# Patient Record
Sex: Female | Born: 1960
Health system: Southern US, Community
[De-identification: ages and names within clinical notes are randomized; demographics above are authoritative.]

## PROBLEM LIST (undated history)

## (undated) DIAGNOSIS — J302 Other seasonal allergic rhinitis: Secondary | ICD-10-CM

## (undated) HISTORY — DX: Other seasonal allergic rhinitis: J30.2

---

## 1998-05-27 ENCOUNTER — Other Ambulatory Visit: Admission: RE | Admit: 1998-05-27 | Discharge: 1998-05-27 | Payer: Self-pay | Admitting: Obstetrics and Gynecology

## 1998-08-05 ENCOUNTER — Other Ambulatory Visit: Admission: RE | Admit: 1998-08-05 | Discharge: 1998-08-05 | Payer: Self-pay | Admitting: Obstetrics and Gynecology

## 1999-12-22 ENCOUNTER — Other Ambulatory Visit: Admission: RE | Admit: 1999-12-22 | Discharge: 1999-12-22 | Payer: Self-pay | Admitting: Obstetrics and Gynecology

## 2000-07-26 ENCOUNTER — Other Ambulatory Visit: Admission: RE | Admit: 2000-07-26 | Discharge: 2000-07-26 | Payer: Self-pay | Admitting: Obstetrics and Gynecology

## 2001-10-15 ENCOUNTER — Other Ambulatory Visit: Admission: RE | Admit: 2001-10-15 | Discharge: 2001-10-15 | Payer: Self-pay | Admitting: Obstetrics and Gynecology

## 2002-10-23 ENCOUNTER — Other Ambulatory Visit: Admission: RE | Admit: 2002-10-23 | Discharge: 2002-10-23 | Payer: Self-pay | Admitting: Obstetrics and Gynecology

## 2003-11-24 ENCOUNTER — Other Ambulatory Visit: Admission: RE | Admit: 2003-11-24 | Discharge: 2003-11-24 | Payer: Self-pay | Admitting: Obstetrics and Gynecology

## 2005-01-05 ENCOUNTER — Other Ambulatory Visit: Admission: RE | Admit: 2005-01-05 | Discharge: 2005-01-05 | Payer: Self-pay | Admitting: Obstetrics and Gynecology

## 2006-03-09 ENCOUNTER — Other Ambulatory Visit: Admission: RE | Admit: 2006-03-09 | Discharge: 2006-03-09 | Payer: Self-pay | Admitting: Obstetrics and Gynecology

## 2008-06-16 ENCOUNTER — Encounter: Admission: RE | Admit: 2008-06-16 | Discharge: 2008-06-16 | Payer: Self-pay | Admitting: Obstetrics and Gynecology

## 2009-02-15 HISTORY — PX: BUNIONECTOMY: SHX129

## 2009-05-18 IMAGING — US UNKNOWN US STUDY
1 series · 2 of 2 positions shown · non-contrast
Comparison: 05/12/2008

CLINICAL DATA: The patient's physician feels a lump in the 2
o'clock position of the right breast.

DIGITAL DIAGNOSTIC  RIGHT  MAMMOGRAM  ] AND RIGHT BREAST ULTRASOUND

[Series 1: unknown us study · 2 of 2 slices shown]
[im 1/2]
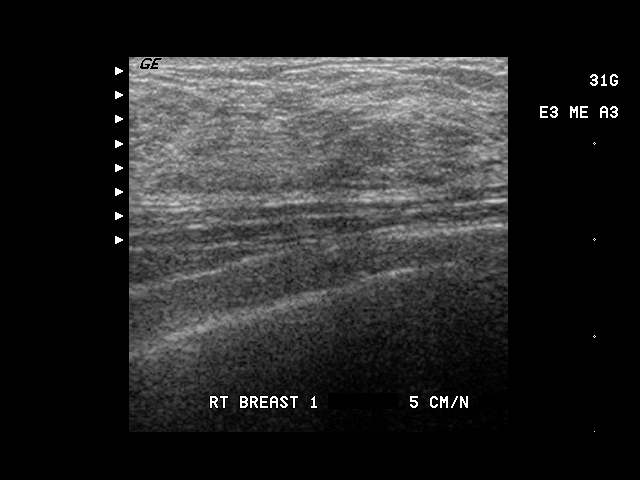
[im 2/2]
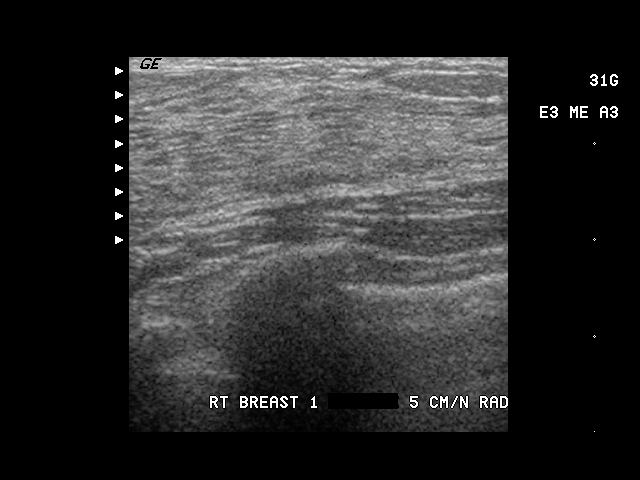

[2 of 2 positions shown; findings below may reference images not displayed]

FINDINGS: A spot tangential view of the palpable finding in the
right upper inner quadrant demonstrates dense fibroglandular tissue
with no distinct mass or distortion.  Recent screening mammogram
dated 05/12/2008 is normal.

On physical exam, I palpate a ridge of thickening at 1 o'clock 5 cm
from the right nipple.

Ultrasound is performed, showing dense fibroglandular tissue with
no mass, distortion or shadowing to suggest malignancy.
IMPRESSION: No mammographic or sonographic evidence of malignancy.  Yearly
screening mammography is suggested unless clinically indicated
earlier.

BI-RADS CATEGORY 1:  Negative.

## 2009-05-18 IMAGING — MG MM DIAGNOSTIC UNILATERAL R
1 series · 1 of 1 positions shown · non-contrast
Comparison: 05/12/2008

CLINICAL DATA: The patient's physician feels a lump in the 2
o'clock position of the right breast.

DIGITAL DIAGNOSTIC  RIGHT  MAMMOGRAM  ] AND RIGHT BREAST ULTRASOUND

[R CC]
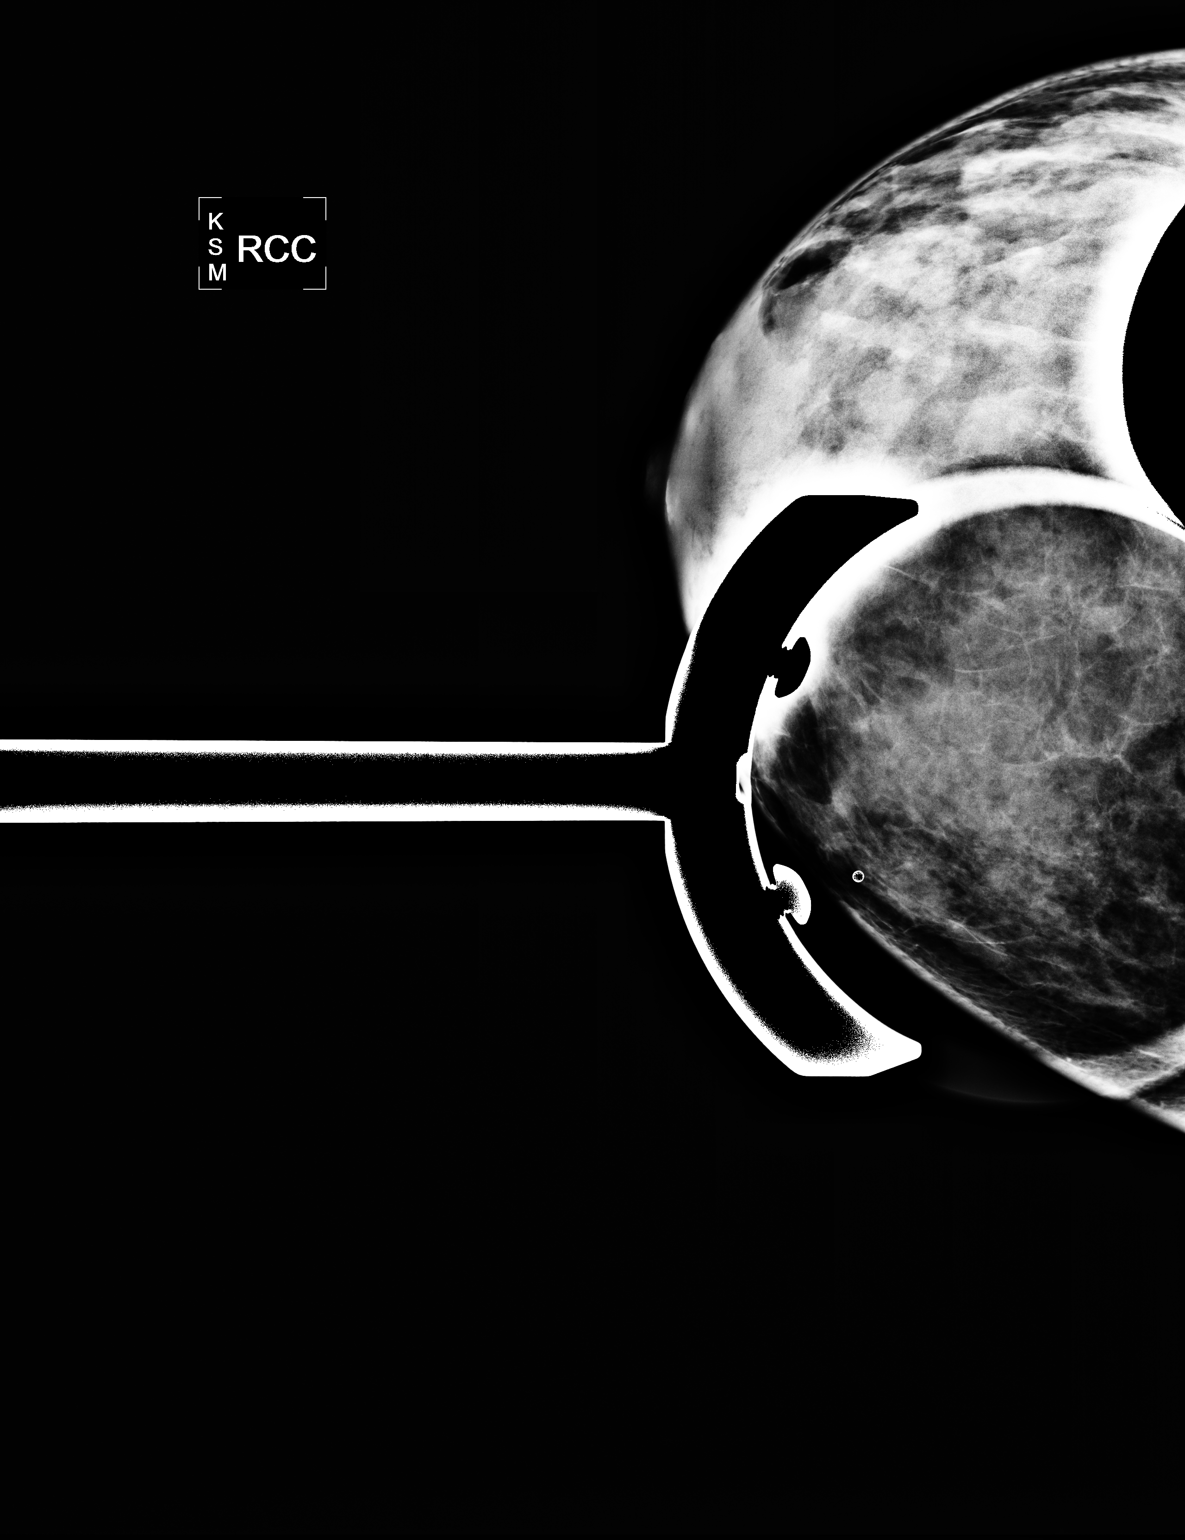

[1 of 1 positions shown; findings below may reference images not displayed]

FINDINGS: A spot tangential view of the palpable finding in the
right upper inner quadrant demonstrates dense fibroglandular tissue
with no distinct mass or distortion.  Recent screening mammogram
dated 05/12/2008 is normal.

On physical exam, I palpate a ridge of thickening at 1 o'clock 5 cm
from the right nipple.

Ultrasound is performed, showing dense fibroglandular tissue with
no mass, distortion or shadowing to suggest malignancy.
IMPRESSION: No mammographic or sonographic evidence of malignancy.  Yearly
screening mammography is suggested unless clinically indicated
earlier.

BI-RADS CATEGORY 1:  Negative.

## 2010-02-28 ENCOUNTER — Ambulatory Visit: Payer: Self-pay | Admitting: Sports Medicine

## 2010-02-28 DIAGNOSIS — M21619 Bunion of unspecified foot: Secondary | ICD-10-CM | POA: Insufficient documentation

## 2010-02-28 DIAGNOSIS — M2170 Unequal limb length (acquired), unspecified site: Secondary | ICD-10-CM | POA: Insufficient documentation

## 2010-02-28 DIAGNOSIS — M79609 Pain in unspecified limb: Secondary | ICD-10-CM | POA: Insufficient documentation

## 2010-04-25 ENCOUNTER — Ambulatory Visit: Payer: Self-pay | Admitting: Sports Medicine

## 2011-01-17 NOTE — Assessment & Plan Note (Signed)
Summary: ORTHOTICS,MC   Vital Signs:  Patient profile:   50 year old female BP sitting:   125 / 85  Vitals Entered By: Lillia Pauls CMA (Apr 25, 2010 9:30 AM)  History of Present Illness: Amber Brennan just ha bunin surgery by dr Johnney Ou 8 wks ago doing very well RT foot not swelling or painful in cast shoe now  comes for orthotics wants to get back into walking leg length diff chronic forefoot pain but less on left  Allergies: 1)  ! Pcn 2)  ! Sulfa  Physical Exam  General:  Well-developed,well-nourished,in no acute distress; alert,appropriate and cooperative throughout examination Msk:  reapaired bunion on RT looks good arch eight sitting fairly high  long arch flattens some w standing some bilat collapse of trans arch gait pronates without orthotics   Impression & Recommendations:  Problem # 1:  FOOT PAIN, RIGHT (ICD-729.5)  This is less now some on left forefoot and 1 mtp  Orders: [employer], each unit (Z6109)  Problem # 2:  BUNION, RIGHT FOOT (ICD-727.1)  better post op and looks like good result  Orders: [employer], each unit (L3002)  Problem # 3:  UNEQUAL LEG LENGTH (ICD-736.81)  left is actually 1.5 cm  Patient was fitted for a standard, cushioned, semi-rigid orthotic.  The orthotic was heated and the patient stood on the orthotic blank positioned on the orthotic stand. The patient was positioned in subtalar neutral position and 10 degrees of ankle dorsiflexion in a weight bearing stance. After completion of molding a stable based was applied to the orthotic blank.   The blank was ground to a stable position for weight bearing. size 7 black stripe fasttek base blue EVA posting  lift on left w burgundy EVA additional orthotic padding  gait looks good w shoulders and hips more level p completion  reck prn  time 35 mins  Orders: [employer], each unit [phone])

## 2011-01-17 NOTE — Assessment & Plan Note (Signed)
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  R bunion.  Getting thigh/hip pain after walking. She has appt with Dr Fonnie Jarvis tomorrow for surgery.    Ballroom dancer, Yoga, weight lifting (3 days/wk), elliptical bike ( 3days/wk).  Used to ballroom dance a lot but now down to 45 min once a week.  Just walking can cause pain.  Work shoes and dance shoes have about 2 - 2 1/2 inches heel.   R 4th MT swollen since 02/2009.    Allergies (verified): 1)  ! Pcn 2)  ! Sulfa  Family History: Mom: bunion   Foot/Ankle Exam  General:    Well-developed, well-nourished ,normal body habitus; no deformities, normal grooming.    Gait:    Normal heel-toe gait pattern bilaterally.    Skin:    Intact with no scars, lesions, rashes, cafe-au-lait spots or bruising.    Inspection:    Enlarged R 1st MTP with spurring Transverse collapse R foot > L Deviation without defomity of R 2nd MT Length L leg > R by 1 cm  Palpation:    non-tender to palpation over distal leg, medial and lateral ankle, distal achilles tendon, medial and lateral hindfoot, posterior heel, plantar heel, midfoot and forefoot bilaterally.   Foot Exam:    Right:    Inspection/Palpation:  Full rom in all toes   Impression & Recommendations:  Problem # 1:  FOOT PAIN, RIGHT (ICD-729.5) Leg length difference is cause of her foot collapse (mechanics problem).  Bunion is also the caue of her pain, but bunionectomy may not correct the mechanics problem.  Since pt is having pain one option is to build a set of orthotics to give increase lenght to R leg.  Pt can have surgery and afterwards she can make her orthotics.   We tried a felt insole in her dress/work shoes to  give her some support and correct the leg length difference.    Problem # 2:  BUNION, RIGHT FOOT (ICD-727.1) I felt the surgical option suggested by Dr Lestine Box was reasonable with her hx of foot pain  Problem # 3:  UNEQUAL LEG LENGTH (ICD-736.81) at completion of surgery I think she needs orthtoics and some correction of leg length to lessen chance of recurrence  Patient Instructions: 1)  After your surgery, if you still vave pain in your foot we can consider building you a set of orthotics to prevent progresion of the disease and give you pain relief.   2)  Try using the felt lift in your right shoe.  This may help you with pain.  3)  Please schedule a follow-up appointment after your rehabilitation is done after the bunionectomy.

## 2011-02-14 ENCOUNTER — Encounter: Payer: Self-pay | Admitting: Sports Medicine

## 2011-02-14 ENCOUNTER — Ambulatory Visit (INDEPENDENT_AMBULATORY_CARE_PROVIDER_SITE_OTHER): Payer: BC Managed Care – PPO | Admitting: Sports Medicine

## 2011-02-14 DIAGNOSIS — M25519 Pain in unspecified shoulder: Secondary | ICD-10-CM

## 2011-02-14 DIAGNOSIS — M62838 Other muscle spasm: Secondary | ICD-10-CM

## 2011-02-23 NOTE — Assessment & Plan Note (Signed)
Summary: RT FRONT SHOULDER PAIN,L BACK SHOULDER PAIN,MC   Vital Signs:  Patient profile:   50 year old female Height:      64.75 inches Weight:      130 pounds BMI:     21.88 BP sitting:   120 / 84  Vitals Entered By: Rochele Pages RN (February 14, 2011 11:45 AM)   History of Present Illness: R shoulder - Pain since 2nd week of January -First happened during yoga pose - (Weight supported arm extension with hip extension) -Persistent pain --Tried: 6 weeks rest, heat, biofreeze  L shoulder/trapezius pain - Started shortly after incident -Tight -"burning sensation" -HX of pulled muscle - trapezius - May 2011 - went away after 1 month  Works - Office manager  no serious shoulder injuries in past  tried massage and this helped only temporarily and would not relieve left trap tried chirpractic w no real relief  Allergies: 1)  ! Pcn 2)  ! Sulfa  Physical Exam  General:  Well-developed,well-nourished,in no acute distress; alert,appropriate and cooperative throughout examination Msk:  RT shoulder Inspection reveals no abnormalities or assymetry; no atrophy noted; palpation is unremarkable;  ROM is full in all planes. specific strength testing of Rotator cuff mm reveals good strength throughout when she has arms at side; with elevation she gets pain on IR and ER testing and some weakness on IR pushhoff test is weak on RT and she has limited ROM 2/2 pain  no signs of impingement; speeds is painful but yergason's tests normal;  OBrien's is + for possible labral pathology. Jobe relocation test causes clicking but no severe pain.  norm scapular function observed.   negative painful arc and no drop arm sign.  Left trapezius is tight w spasm noted  full ROM of neck with no pain or limits spurling test neg    Impression & Recommendations:  Problem # 1:  SHOULDER PAIN, RIGHT (ICD-719.41) I am concerned about labrum  will RTC to see one of fellows and scan the shoudler to be sure no  bicipital or RC tears  work IR strength to stabilize until then  Problem # 2:  MUSCLE SPASM, TRAPEZIUS MUSCLE, LEFT (ICD-728.85) try shake out exercises  I am concerned there may be localized nerve entrapment  will try on HS gabapentin to see if we can break spasm  reck on RTC  Complete Medication List: 1)  Gabapentin 300 Mg Caps (Gabapentin) .... Take 1 by mouth at bedtime  Patient Instructions: 1)  Left Arm Shake Outs - 3 x 30 reps daily 2)  Curls - 3x15 - with band 3)  Internal Rotation - 3x15 - with band 4)  External Rotation - 3x15 - with band 5)  Flys ("[place]" - at gym) - 3 sets x 15 reps (5 - 10 pounds) 6)  Gabapentin 300 mg at night Prescriptions: GABAPENTIN 300 MG CAPS (GABAPENTIN) take 1 by mouth at bedtime  #30 x 3   Entered by:   Rochele Pages RN   Authorized by:   Enid Baas MD   Signed by:   Rochele Pages RN on 02/14/2011   Method used:   Electronically to        [facility] [address] [phone]* (retail)       [address] [place], [place]  [address]       Ph: [phone]       Fax: [phone]   RxID:   [id number]  Orders Added: 1)  Est. Patient Level IV [id number]

## 2011-03-07 ENCOUNTER — Ambulatory Visit (INDEPENDENT_AMBULATORY_CARE_PROVIDER_SITE_OTHER): Payer: BC Managed Care – PPO | Admitting: Family Medicine

## 2011-03-07 DIAGNOSIS — S43429A Sprain of unspecified rotator cuff capsule, initial encounter: Secondary | ICD-10-CM

## 2011-03-07 DIAGNOSIS — M47812 Spondylosis without myelopathy or radiculopathy, cervical region: Secondary | ICD-10-CM

## 2011-03-07 DIAGNOSIS — M62838 Other muscle spasm: Secondary | ICD-10-CM

## 2011-03-07 DIAGNOSIS — M75101 Unspecified rotator cuff tear or rupture of right shoulder, not specified as traumatic: Secondary | ICD-10-CM | POA: Insufficient documentation

## 2011-03-07 MED ORDER — NITROGLYCERIN 0.1 MG/HR TD PT24: MEDICATED_PATCH | TRANSDERMAL | Status: DC

## 2011-03-07 NOTE — Assessment & Plan Note (Addendum)
-   Multi-level degenerative changes as seen on x-rays from chiropractor 02/21/11 - May have element of cervical radiculopathy contributing to shoulder/trapezius pain and spasm - Start taking gabapentin 300mg  q hs as prescribed last office visit - Reviewed home exercises for the neck - May follow-up with chiropractor if desired - f/u 4-6 weeks

## 2011-03-07 NOTE — Assessment & Plan Note (Addendum)
1) Rt subscap tear: - measured 1.27cm x 0.28cm with volume 0.33 cm2 on MSK u/s today - Start on NTG protocol 0.1mg  1/2 patch to shoulder daily.  Reviewed common side effects including HA & rash.  May take tylenol or ibuprofen as needed for HA - Cont home shoulder exercises - f/u 4-6 weeks for re-evaluation & repeat ultrasound

## 2011-03-08 NOTE — Progress Notes (Signed)
Subjective:    Patient ID: Amber Brennan is a 50 y.o. female.  Chief Complaint: HPI 50yo female to office for f/u of R shoulder pain & Lt trapezius pain.  Symptoms only slightly improved.  Saw chiropractor 02/21/11 where x-rays showed neck DJD and has some laser treatments on the shoulder.  Injured shoulder initially 12/2010 while doing yoga.  Cont to have good ROM, but still having pain & popping in the shoulder.  Feels slightly weaker on that side.  Also cont to have tight/burning sensation in left trapezius.  Did not start Gabapentin last visit b/c of fear of side effects.  Has been doing home exercises as directed.    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 Not on file.   Social History Main Topics  . Smoking status: Not on file  . Smokeless tobacco: Not on file  . Alcohol Use: Not on file  . Drug Use: Not on file  . Sexually Active: Not on file    ROS  per HPI, otherwise neg   Objective:   Right Shoulder Exam   Tenderness  The patient is experiencing tenderness in the biceps tendon.  Range of Motion  The patient has normal right shoulder ROM.  Muscle Strength  Abduction: 5/5  Internal Rotation: 4/5  External Rotation: 4/5  Supraspinatus: 5/5  Subscapularis: 4/5  Biceps: 5/5   Tests  Apprehension: negative Drop Arm: negative Hawkin's test: negative Impingement: negative  Other  Erythema: absent Sensation: normal  Comments:  (+)Obriens test, (+)Clunk test for labral pathology. Pain & weakness noted with lift-off test No scapular dyskinesia. No atrophy.   Left Shoulder Exam  Left shoulder exam is normal.  Comments:  TTP in left trapezius with associated mild spasm.  No atrophy noted.         C-spine: ROM decreased by 25% in all planes without pain.  No midline or paraspinal tenderness.  Neg Spurlings to the left, Spurlings to the right reproduces L shoulder pain/burning.  NEURO:  sensation intact to light touch.  DTR +2/4 bicep, tricep, brachioradialis VASC: pulse 2+ & symmetric radial artery b/l  IMAGING: - Personally reviewed x-rays from 02/21/11 from chiropractor showing multi-level degenerative changes, most prominent in area of C4-7.  Large anterior osteophyte complex at area of C4-5, possible posterior spurring at C5.  Mild retrolisthesis of C5 on C6 and C4 on C5.  Flattening of cervical lordosis.  - MSK u/s: R shoulder- normal appearing bicep tendon.  Subscap with intrasubstance tear measuring 1.27cm x 0.28cm (volume 0.33 cm2), increased doppler flow noted in this area.  Tear visible both in long & short views, gapping of the tear noted with dynamic testing.  Normal appearing supraspinatus.  Normal appearing infraspinatus.  Small amount of hypoechoic fluid surrounding AC-joint.  Images saved.  Assessment:     1. Rotator cuff tear, right   2. DJD (degenerative joint disease) of cervical spine   3. MUSCLE SPASM, TRAPEZIUS MUSCLE, LEFT        Plan:    1) Rt subscap tear: - measured 1.27cm x 0.28cm with volume 0.33 cm2 on MSK u/s today - Start on NTG protocol 0.1mg  1/2 patch to shoulder daily.  Reviewed common side effects including HA & rash.  May take tylenol or ibuprofen as needed for HA - Cont home shoulder exercises - f/u 4-6 weeks for re-evaluation & repeat ultrasound  2) Cervical DJD: - Multi-level  degenerative changes as seen on x-rays from chiropractor 02/21/11 - May have element of cervical radiculopathy contributing to shoulder/trapezius pain and spasm - Start taking gabapentin 300mg  q hs as prescribed last office visit - Reviewed home exercises for the neck - May follow-up with chiropractor if desired - f/u 4-6 weeks  3) Lt trapezius spasm  - Unchanged, may be related to cervical radiculopathy as x-rays show multi-level degenerative changes - Start gabapentin as prescribed last visit - Cont home exercises - reassess on f/u in 4-6 weeks

## 2011-03-08 NOTE — Assessment & Plan Note (Signed)
-   Unchanged, may be related to cervical radiculopathy as x-rays show multi-level degenerative changes - Start gabapentin as prescribed last visit - Cont home exercises - reassess on f/u in 4-6 weeks

## 2011-04-04 ENCOUNTER — Other Ambulatory Visit: Payer: BC Managed Care – PPO | Admitting: Family Medicine

## 2011-06-15 ENCOUNTER — Other Ambulatory Visit: Payer: Self-pay | Admitting: Orthopedic Surgery

## 2011-06-15 DIAGNOSIS — M25511 Pain in right shoulder: Secondary | ICD-10-CM

## 2011-06-18 ENCOUNTER — Ambulatory Visit
Admission: RE | Admit: 2011-06-18 | Discharge: 2011-06-18 | Disposition: A | Discharge status: Home / Self Care | Payer: BC Managed Care – PPO | Source: Ambulatory Visit | Attending: Orthopedic Surgery | Admitting: Orthopedic Surgery

## 2011-06-18 DIAGNOSIS — M25511 Pain in right shoulder: Secondary | ICD-10-CM

## 2012-02-05 ENCOUNTER — Ambulatory Visit (INDEPENDENT_AMBULATORY_CARE_PROVIDER_SITE_OTHER): Payer: BC Managed Care – PPO | Admitting: Family Medicine

## 2012-02-05 DIAGNOSIS — J101 Influenza due to other identified influenza virus with other respiratory manifestations: Secondary | ICD-10-CM

## 2012-02-05 DIAGNOSIS — R05 Cough: Secondary | ICD-10-CM

## 2012-02-05 DIAGNOSIS — R509 Fever, unspecified: Secondary | ICD-10-CM

## 2012-02-05 DIAGNOSIS — R059 Cough, unspecified: Secondary | ICD-10-CM

## 2012-02-05 DIAGNOSIS — J111 Influenza due to unidentified influenza virus with other respiratory manifestations: Secondary | ICD-10-CM

## 2012-02-05 LAB — POCT CBC
Granulocyte percent: 78.1 %G (ref 37–80)
Hemoglobin: 13.6 g/dL (ref 12.2–16.2)
MCV: 96.2 fL (ref 80–97)
MID (cbc): 0.3 (ref 0–0.9)
MPV: 7.8 fL (ref 0–99.8)
POC MID %: 7.3 %M (ref 0–12)
Platelet Count, POC: 172 10*3/uL (ref 142–424)
RBC: 4.44 M/uL (ref 4.04–5.48)
WBC: 4.4 10*3/uL — AB (ref 4.6–10.2)

## 2012-02-05 LAB — POCT INFLUENZA A/B
Influenza A, POC: NEGATIVE
Influenza B, POC: POSITIVE

## 2012-02-05 MED ORDER — OSELTAMIVIR PHOSPHATE 75 MG PO CAPS: 75.0000 mg | ORAL_CAPSULE | Freq: Two times a day (BID) | ORAL | Status: AC

## 2012-02-05 NOTE — Progress Notes (Signed)
Subjective:    Patient ID: Amber Brennan, female    DOB: 08-11-1961, 51 y.o.   MRN: [id number]  HPI Amber Brennan is a 51 y.o. female In [place] last week, returned 3 days ago.  Sx's started that evening.  Nasal congestion - progressed to chest congestion 2 days ago, dry cough.  Pains in both arms, pain in body last night.  Jerking/chills/shaking last night for about 10 minutes.  No hx seizure d/o.  Felt like chills all over - temp 102 last night.  Pain below R ribs after shaking, now resolved.  No dysuria  WU:[id number] cold and sinus, advil, cool compresses and fluids.   No flu shot this year.  Others coughing on plane.   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  HENT: Positive for congestion, rhinorrhea and sneezing. Negative for sore throat.   Eyes: Positive for discharge and redness.       Runny eyes.  Respiratory: Positive for cough. Negative for shortness of breath and wheezing.   Cardiovascular: Negative for chest pain.       Sore in chest with coughing only  Gastrointestinal: Negative for nausea, vomiting, abdominal pain and diarrhea.  Genitourinary: Negative for urgency, frequency, hematuria and difficulty urinating.       No bowel/bladder incontinence  Musculoskeletal: Positive for myalgias and arthralgias.       Diffuse myalgias/arthralgias with current illness.  Skin: Negative for rash.  Neurological: Positive for headaches.       Objective:   Physical Exam  Constitutional: She is oriented to person, place, and time. She appears well-developed and well-nourished. No distress.  HENT:  Head: Normocephalic and atraumatic.  Right Ear: Hearing, tympanic membrane, external ear and ear canal normal.  Left Ear: Hearing, tympanic membrane, external ear and ear canal normal.  Nose: Nose normal.  Mouth/Throat: Mucous membranes are normal. No oral lesions. Posterior oropharyngeal erythema  present. No oropharyngeal exudate.         Post op erythematous patches, but no exudate or tonsilar hypertrophy.  Eyes: Conjunctivae and EOM are normal. Pupils are equal, round, and reactive to light.  Neck: Normal range of motion. Neck supple.  Cardiovascular: Normal rate, regular rhythm, normal heart sounds and intact distal pulses.   No murmur heard. Pulmonary/Chest: Effort normal and breath sounds normal. No respiratory distress. She has no wheezes. She has no rhonchi. She has no rales.  Abdominal: Soft. There is no tenderness. There is no CVA tenderness.  Musculoskeletal: Normal range of motion. She exhibits no edema.       Nonfocal.  Lymphadenopathy:    She has no cervical adenopathy.  Neurological: She is alert and oriented to person, place, and time.  Skin: Skin is warm and dry. No rash noted.  Psychiatric: She has a normal mood and affect. Her behavior is normal.    Results for orders placed in visit on 02/05/12  POCT CBC      Component Value Range   WBC 4.4 (*) 4.6 - 10.2 (K/uL)   Lymph, poc 0.6  0.6 - 3.4    POC LYMPH PERCENT 14.6  10 - 50 (%L)   MID (cbc) 0.3  0 - 0.9    POC MID % 7.3  0 - 12 (%M)   POC Granulocyte 3.4  2 - 6.9    Granulocyte percent 78.1  37 - 80 (%G)   RBC 4.44  4.04 - 5.48 (  M/uL)   Hemoglobin 13.6  12.2 - 16.2 (g/dL)   HCT, POC 16.1  09.6 - 47.9 (%)   MCV 96.2  80 - 97 (fL)   MCH, POC 30.6  27 - 31.2 (pg)   MCHC 31.9  31.8 - 35.4 (g/dL)   RDW, POC 04.5     Platelet Count, POC 172  142 - 424 (K/uL)   MPV 7.8  0 - 99.8 (fL)  POCT RAPID STREP A (OFFICE)      Component Value Range   Rapid Strep A Screen Negative  Negative   POCT INFLUENZA A/B      Component Value Range   Influenza A, POC Negative     Influenza B, POC Positive           Assessment & Plan:  Amber Brennan is a 51 y.o. female

## 2012-02-05 NOTE — Patient Instructions (Addendum)
Tamilfu twice per day for 5 days. advil as needed, and fluids, rest.  mucinex if needed for cough. Return to the clinic or go to the nearest emergency room if any of your symptoms worsen or new symptoms occur.  Influenza Facts Flu (influenza) is a contagious respiratory illness caused by the influenza viruses. It can cause mild to severe illness. While most healthy people recover from the flu without specific treatment and without complications, older people, young children, and people with certain health conditions are at higher risk for serious complications from the flu, including death. CAUSES   The flu virus is spread from person to person by respiratory droplets from coughing and sneezing.   A person can also become infected by touching an object or surface with a virus on it and then touching their mouth, eye or nose.   Adults may be able to infect others from 1 day before symptoms occur and up to 7 days after getting sick. So it is possible to give someone the flu even before you know you are sick and continue to infect others while you are sick.  SYMPTOMS   Fever (usually high).   Headache.   Tiredness (can be extreme).   Cough.   Sore throat.   Runny or stuffy nose.   Body aches.   Diarrhea and vomiting may also occur, particularly in children.   These symptoms are referred to as "flu-like symptoms". A lot of different illnesses, including the common cold, can have similar symptoms.  DIAGNOSIS   There are tests that can determine if you have the flu as long you are tested within the first 2 or 3 days of illness.   A doctor's exam and additional tests may be needed to identify if you have a disease that is a complicating the flu.  RISKS AND COMPLICATIONS  Some of the complications caused by the flu include:  Bacterial pneumonia or progressive pneumonia caused by the flu virus.   Loss of body fluids (dehydration).   Worsening of chronic medical conditions, such as  heart failure, asthma, or diabetes.   Sinus problems and ear infections.  HOME CARE INSTRUCTIONS   Seek medical care early on.   If you are at high risk from complications of the flu, consult your health-care provider as soon as you develop flu-like symptoms. Those at high risk for complications include:   People 65 years or older.   People with chronic medical conditions, including diabetes.   Pregnant women.   Young children.   Your caregiver may recommend use of an antiviral medication to help treat the flu.   If you get the flu, get plenty of rest, drink a lot of liquids, and avoid using alcohol and tobacco.   You can take over-the-counter medications to relieve the symptoms of the flu if your caregiver approves. (Never give aspirin to children or teenagers who have flu-like symptoms, particularly fever).  PREVENTION  The single best way to prevent the flu is to get a flu vaccine each fall. Other measures that can help protect against the flu are:  Antiviral Medications   A number of antiviral drugs are approved for use in preventing the flu. These are prescription medications, and a doctor should be consulted before they are used.   Habits for Good Health   Cover your nose and mouth with a tissue when you cough or sneeze, throw the tissue away after you use it.   Wash your hands often with soap and  water, especially after you cough or sneeze. If you are not near water, use an alcohol-based hand cleaner.   Avoid people who are sick.   If you get the flu, stay home from work or school. Avoid contact with other people so that you do not make them sick, too.   Try not to touch your eyes, nose, or mouth as germs ore often spread this way.  IN CHILDREN, EMERGENCY WARNING SIGNS THAT NEED URGENT MEDICAL ATTENTION:  Fast breathing or trouble breathing.   Bluish skin color.   Not drinking enough fluids.   Not waking up or not interacting.   Being so irritable that the  child does not want to be held.   Flu-like symptoms improve but then return with fever and worse cough.   Fever with a rash.  IN ADULTS, EMERGENCY WARNING SIGNS THAT NEED URGENT MEDICAL ATTENTION:  Difficulty breathing or shortness of breath.   Pain or pressure in the chest or abdomen.   Sudden dizziness.   Confusion.   Severe or persistent vomiting.  SEEK IMMEDIATE MEDICAL CARE IF:  You or someone you know is experiencing any of the symptoms above. When you arrive at the emergency center,report that you think you have the flu. You may be asked to wear a mask and/or sit in a secluded area to protect others from getting sick. MAKE SURE YOU:   Understand these instructions.   Monitor your condition.   Seek medical care if you are getting worse, or not improving.  Document Released: 12/07/2003 Document Revised: 08/16/2011 Document Reviewed: 09/02/2009 [facility] Patient Information 2012 [place], [place].

## 2012-03-05 ENCOUNTER — Encounter (HOSPITAL_COMMUNITY): Payer: Self-pay | Admitting: *Deleted

## 2012-03-05 ENCOUNTER — Other Ambulatory Visit: Payer: Self-pay

## 2012-03-05 ENCOUNTER — Ambulatory Visit (INDEPENDENT_AMBULATORY_CARE_PROVIDER_SITE_OTHER): Payer: BC Managed Care – PPO | Admitting: Family Medicine

## 2012-03-05 ENCOUNTER — Emergency Department (HOSPITAL_COMMUNITY): Payer: BC Managed Care – PPO

## 2012-03-05 ENCOUNTER — Observation Stay (HOSPITAL_COMMUNITY)
Admission: EM | Admit: 2012-03-05 | Discharge: 2012-03-05 | Discharge status: Against Medical Advice | Payer: BC Managed Care – PPO | Attending: Emergency Medicine | Admitting: Emergency Medicine

## 2012-03-05 DIAGNOSIS — M542 Cervicalgia: Secondary | ICD-10-CM | POA: Insufficient documentation

## 2012-03-05 DIAGNOSIS — R5383 Other fatigue: Secondary | ICD-10-CM

## 2012-03-05 DIAGNOSIS — M25519 Pain in unspecified shoulder: Secondary | ICD-10-CM | POA: Insufficient documentation

## 2012-03-05 DIAGNOSIS — R6884 Jaw pain: Secondary | ICD-10-CM

## 2012-03-05 DIAGNOSIS — R079 Chest pain, unspecified: Secondary | ICD-10-CM

## 2012-03-05 DIAGNOSIS — Z79899 Other long term (current) drug therapy: Secondary | ICD-10-CM | POA: Insufficient documentation

## 2012-03-05 DIAGNOSIS — R5381 Other malaise: Secondary | ICD-10-CM

## 2012-03-05 LAB — BASIC METABOLIC PANEL
Chloride: 104 mEq/L (ref 96–112)
GFR calc Af Amer: >90 mL/min (ref >90)
GFR calc non Af Amer: >90 mL/min (ref >90)
Potassium: 3.6 mEq/L (ref 3.5–5.1)
Sodium: 138 mEq/L (ref 135–145)

## 2012-03-05 LAB — POCT I-STAT TROPONIN I: Troponin i, poc: 0 ng/mL (ref 0.00–0.08)

## 2012-03-05 LAB — CBC
MCHC: 33.2 g/dL (ref 30.0–36.0)
RDW: 12.9 % (ref 11.5–15.5)
WBC: 6.6 10*3/uL (ref 4.0–10.5)

## 2012-03-05 MED ORDER — ASPIRIN 81 MG PO CHEW
324.0000 mg | CHEWABLE_TABLET | Freq: Once | ORAL | Status: AC
  Administered 2012-03-05: 324 mg via ORAL

## 2012-03-05 MED ORDER — NITROGLYCERIN 0.3 MG SL SUBL
0.4000 mg | SUBLINGUAL_TABLET | SUBLINGUAL | Status: DC | PRN
  Administered 2012-03-05: 0.3 mg via SUBLINGUAL

## 2012-03-05 NOTE — ED Notes (Signed)
Attempted to do EKG per Thurston Hole, NP orders. Pt refused stating "Get those wires away from me. I've already had enough of those tests and I don't want anymore."

## 2012-03-05 NOTE — ED Notes (Signed)
Pt questioning more about CT of heart. Pt very upset that MD that spoke to her about test was not cardiologist. Pt states "This is just ridiculous. I don't think I even want this procedure. I just want to leave." Told pt that I would have Thurston Hole speak to her more in depth on CT

## 2012-03-05 NOTE — ED Provider Notes (Signed)
History     CSN: [id number]  Arrival date & time 03/05/12  1658   First MD Initiated Contact with Patient 03/05/12 1700      Chief Complaint  Patient presents with  . Chest Pain    (Consider location/radiation/quality/duration/timing/severity/associated sxs/prior treatment) HPI  Past Medical History  Diagnosis Date  . Allergy     Past Surgical History  Procedure Date  . Bunionectomy 02/2009    No family history on file.  History  Substance Use Topics  . Smoking status: Never Smoker   . Smokeless tobacco: Not on file  . Alcohol Use: Not on file    OB History    Grav Para Term Preterm Abortions TAB SAB Ect Mult Living                  Review of Systems  Allergies  Penicillins and Sulfonamide derivatives  Home Medications   Current Outpatient Rx  Name Route Sig Dispense Refill  . B COMPLEX-C PO TABS Oral Take 1 tablet by mouth daily.    Marland Kitchen CALCIUM GLUCONATE 500 MG PO TABS Oral Take 500 mg by mouth daily.    Marland Kitchen GLUCOSAMINE-CHONDROITIN 250-200 MG PO TABS Oral Take 1 tablet by mouth daily.    . ADULT MULTIVITAMIN W/MINERALS CH Oral Take 1 tablet by mouth daily.    Gaylord Shih TRI-CYCLEN LO 0.18/0.215/0.25 MG-25 MCG PO TABS      . PATADAY 0.2 % OP SOLN      . VITAMIN E 400 UNITS PO CAPS Oral Take 400 Units by mouth daily.      BP 130/74  Pulse 80  Temp(Src) 98.2 F (36.8 C) (Oral)  Resp 15  SpO2 98%  LMP 02/29/2012  Physical Exam  ED Course  Procedures (including critical care time)   Labs Reviewed  CBC  BASIC METABOLIC PANEL  POCT I-STAT TROPONIN I   Dg Chest 2 View  03/05/2012  *RADIOLOGY REPORT*  Clinical Data: Mid chest and right jaw pain  CHEST - 2 VIEW  Comparison: None  Findings: Normal heart size, mediastinal contours, and pulmonary vascularity. Lungs appear minimally hyperexpanded clear. No pleural effusion or pneumothorax. No acute osseous findings.  IMPRESSION: No acute abnormalities.  Original Report Authenticated By: Lollie Marrow, M.D.       No diagnosis found.    MDM   8469 Report received from Dr. Karma Ganja for CP protocol in CDU.  First troponin negative. No further chest pain. NTG relieved chest pain earlier. Family history of early MI.  Patient expresses that she wants to leave and does not know what is going on.  States that she was not told why she was still here.  Sent from urgent care to get a "few" labs done.  Does not want to spend the night.  Dr. Karma Ganja states she spoke with the patient extensively prior to moving to CDU.  Patient has many of the same questions over and over.  Dr. Karma Ganja and myself both spoke with her extensively.  She will stay until her husband comes to decide. 2100  Patient want to leave AMA.  Discussed the risks involved and patient understands.   Spoke with her and her husband extensively but she "wants to get out of here".  Dr Karma Ganja aware.          Jethro Bastos, NP 03/06/12 1302

## 2012-03-05 NOTE — ED Notes (Signed)
Pt decided to leave ama and not have any further tests. Provider aware

## 2012-03-05 NOTE — ED Notes (Signed)
Patient transported to X-ray 

## 2012-03-05 NOTE — ED Provider Notes (Signed)
History     CSN: [id number]  Arrival date & time 03/05/12  1658   First MD Initiated Contact with Patient 03/05/12 1700      Chief Complaint  Patient presents with  . Chest Pain    (Consider location/radiation/quality/duration/timing/severity/associated sxs/prior treatment) HPI Pt presents with c/o chest pain and neck pain.  Pt states that 2 nights ago she developed neck pressure and pain which yesterday developed into chest pain.  Pt states pain was primarily constant but waxing and waning in nature.  She did state pain radiated to bilateral shoulders.  No sob or nausea or diaphoresis.  No association with exertion. Pt was seen at urgent care today, given SL nitroglycerin which relieved pain and upon arrival to ED patient has 0/10 pain.  Pt was transferred from urgent care for admission for r/o MI.  Pt does have family history of early MI  Past Medical History  Diagnosis Date  . Allergy     Past Surgical History  Procedure Date  . Bunionectomy 02/2009    No family history on file.  History  Substance Use Topics  . Smoking status: Never Smoker   . Smokeless tobacco: Not on file  . Alcohol Use: Not on file    OB History    Grav Para Term Preterm Abortions TAB SAB Ect Mult Living                  Review of Systems ROS reviewed and otherwise negative except for mentioned in HPI  Allergies  Penicillins and Sulfonamide derivatives  Home Medications   Current Outpatient Rx  Name Route Sig Dispense Refill  . B COMPLEX-C PO TABS Oral Take 1 tablet by mouth daily.    Marland Kitchen CALCIUM GLUCONATE 500 MG PO TABS Oral Take 500 mg by mouth daily.    Marland Kitchen GLUCOSAMINE-CHONDROITIN 250-200 MG PO TABS Oral Take 1 tablet by mouth daily.    . ADULT MULTIVITAMIN W/MINERALS CH Oral Take 1 tablet by mouth daily.    Gaylord Shih TRI-CYCLEN LO 0.18/0.215/0.25 MG-25 MCG PO TABS      . PATADAY 0.2 % OP SOLN      . VITAMIN E 400 UNITS PO CAPS Oral Take 400 Units by mouth daily.      BP 124/75  Pulse  78  Temp(Src) 98.2 F (36.8 C) (Oral)  Resp 27  SpO2 98%  LMP 02/29/2012 Vitals reviewed Physical Exam Physical Examination: General appearance - alert, well appearing, and in no distress Mental status - alert, oriented to person, place, and time Eyes - pupils equal and reactive, no scleral icterus Mouth - mucous membranes moist, pharynx normal without lesions Chest - clear to auscultation, no wheezes, rales or rhonchi, symmetric air entry Heart - normal rate, regular rhythm, normal S1, S2, no murmurs, rubs, clicks or gallops Abdomen - soft, nontender, nondistended, no masses or organomegaly, nontender Musculoskeletal - no joint tenderness, deformity or swelling Extremities - peripheral pulses normal, no pedal edema, no clubbing or cyanosis Skin - normal coloration and turgor, no rashes  ED Course  Procedures (including critical care time)  Date: 03/05/2012  Rate: 74  Rhythm: normal sinus rhythm  QRS Axis: normal  Intervals: normal  ST/T Wave abnormalities: normal  Conduction Disutrbances:none  Narrative Interpretation: poor r wave progression  Old EKG Reviewed: none available  8:34 PM Long discussion with patient about plans for testing and CT coronary angio to r/o MI.  I have answered her questions to the best of my ability.  Pt would prefer not to stay overnight, but is considering.   Pt is deciding whether or not she will proceed with testing.  She will talk with her husband.  A complaint she had is the noise at the nurses station and personal talking going on there.  I have discussed this with the charge nurse who will address.       Labs Reviewed  CBC  BASIC METABOLIC PANEL  POCT I-STAT TROPONIN I   Dg Chest 2 View  03/05/2012  *RADIOLOGY REPORT*  Clinical Data: Mid chest and right jaw pain  CHEST - 2 VIEW  Comparison: None  Findings: Normal heart size, mediastinal contours, and pulmonary vascularity. Lungs appear minimally hyperexpanded clear. No pleural effusion or  pneumothorax. No acute osseous findings.  IMPRESSION: No acute abnormalities.  Original Report Authenticated By: Lollie Marrow, M.D.     No diagnosis found.    MDM  Patient presents with chest pain and tightness with radiation to her neck and bilateral shoulders which was intermittent over the past 2 days and completely relieved by one dose of nitroglycerin. Her EKG was reassuring and her initial troponin was also negative. She was placed on the chest pain protocol and plan was for a coronary CT angiogram of her chest. After long discussion with patient about risk stratification of chest pain and risk of possible MI/ACS, pt was initially unsure about agreement in staying for testing, I and PA in CDU both made attempts at answering her questions and providing reassurance about the CDU chest pain protocol and the tests we recommended.  She ultimately made the decision to leave AMA        Ethelda Chick, MD 03/05/12 [phone]

## 2012-03-05 NOTE — ED Notes (Signed)
Iv per ems 

## 2012-03-05 NOTE — ED Notes (Signed)
Provider at bedside informing pt on CPP.

## 2012-03-05 NOTE — ED Notes (Signed)
The pt has had chest tightness since Sunday with lt jaw pain.  4 baby aspirin and sl nitro x1 enroute with relief. The pt refused the 2nd nitro due to  A headache.  Alert oriented

## 2012-03-05 NOTE — Progress Notes (Signed)
  Subjective:    Patient ID: Amber Brennan, female    DOB: [birth year], 51 y.o.   MRN: [id number]  HPI  3 days of chest pressure.  Began as a burning sensation in the jaw and neck, then to pressure in neck, chest.  No SOB, HA, dizziness.  Feels weak, generalized malaise.  Some diarrhea for the first 2 days, had been travelling, but no GI symptoms since then.  No history of HTN, elevated lipids.  Exercises regularly.  Non-smoker.  Review of Systems As above, otherwise negative.    Objective:   Physical Exam  Vital signs noted. Well-developed, well nourished WF who is awake, alert and oriented, in NAD. HEENT: [place]/AT, sclera and conjunctiva are clear.  Neck: supple, non-tender, no lymphadenopathy, thyromegaly. No carotid bruits. Heart: RRR, no murmur, rubs or gallops. Lungs: CTA Skin: warm and dry without rash.  EKG: NSR.  ASA 324 mg given PO.  IV 20 gauge, NS KVO.  NTG 0.4 mg SL.   EMS contacted.  ED notified. Complains of HA, some improvement in pain on the left side of the neck/jaw.        Assessment & Plan:  CP, jaw pain.  Despite normal EKG, her symptoms are concerning.  Transport to ED for rule out MI.  Discussed with Dr. Patsy Lager.

## 2012-03-05 NOTE — ED Notes (Signed)
Pt sts she has had chest pain off and on since a couple days ago with some pain in the tops of her arms and into the right side of her neck. Denies any nausea or vomiting or SOB. Pt states she is healthy and doesn't have any health problems

## 2012-03-05 NOTE — ED Notes (Signed)
Pt left ama. Pt expressed gratitude for help but stated "I'm just not old enough to spend the night in the hospital yet." NAD noted currently.

## 2012-03-05 NOTE — Progress Notes (Signed)
Agree with care by Theora Gianotti PA-C.  Patient sent to ED for further evaluation

## 2012-03-05 NOTE — ED Notes (Signed)
Patient states she  Is waiting for her husband and that she would be leaving AMA

## 2012-03-06 ENCOUNTER — Inpatient Hospital Stay (HOSPITAL_COMMUNITY): Admit: 2012-03-06 | Payer: BC Managed Care – PPO

## 2012-03-06 ENCOUNTER — Telehealth: Payer: Self-pay | Admitting: Physician Assistant

## 2012-03-06 ENCOUNTER — Ambulatory Visit (HOSPITAL_COMMUNITY): Admission: RE | Admit: 2012-03-06 | Payer: BC Managed Care – PPO | Source: Ambulatory Visit

## 2012-03-06 NOTE — Telephone Encounter (Signed)
Please call this patient for an update.  I read the ED record and see that she left against their advice.  I would recommend that she see a cardiologist.  If she agrees, I'll refer her.

## 2012-03-06 NOTE — Telephone Encounter (Signed)
lmom to cb with update.

## 2012-03-07 NOTE — ED Provider Notes (Signed)
Medical screening examination/treatment/procedure(s) were conducted as a shared visit with non-physician practitioner(s) and myself.  I personally evaluated the patient during the encounter  I saw this patient primarily  Ethelda Chick, MD 03/07/12 [phone]

## 2012-03-08 ENCOUNTER — Ambulatory Visit (INDEPENDENT_AMBULATORY_CARE_PROVIDER_SITE_OTHER): Payer: BC Managed Care – PPO | Admitting: Cardiology

## 2012-03-08 ENCOUNTER — Telehealth: Payer: Self-pay | Admitting: Cardiology

## 2012-03-08 ENCOUNTER — Other Ambulatory Visit: Payer: Self-pay | Admitting: Cardiovascular Disease

## 2012-03-08 ENCOUNTER — Encounter: Payer: Self-pay | Admitting: Cardiology

## 2012-03-08 ENCOUNTER — Ambulatory Visit (HOSPITAL_COMMUNITY)
Admission: RE | Admit: 2012-03-08 | Discharge: 2012-03-08 | Disposition: A | Discharge status: Home / Self Care | Payer: BC Managed Care – PPO | Source: Ambulatory Visit | Attending: Cardiology | Admitting: Cardiology

## 2012-03-08 DIAGNOSIS — R079 Chest pain, unspecified: Secondary | ICD-10-CM

## 2012-03-08 MED ORDER — NITROGLYCERIN 0.4 MG SL SUBL
SUBLINGUAL_TABLET | SUBLINGUAL | Status: AC
  Filled 2012-03-08: qty 25

## 2012-03-08 MED ORDER — NITROGLYCERIN 0.4 MG SL SUBL
0.4000 mg | SUBLINGUAL_TABLET | Freq: Once | SUBLINGUAL | Status: AC
  Administered 2012-03-08: 0.4 mg via SUBLINGUAL

## 2012-03-08 MED ORDER — METOPROLOL TARTRATE 1 MG/ML IV SOLN
5.0000 mg | INTRAVENOUS | Status: DC | PRN
  Administered 2012-03-08 (×2): 5 mg via INTRAVENOUS

## 2012-03-08 MED ORDER — IOHEXOL 350 MG/ML SOLN
80.0000 mL | Freq: Once | INTRAVENOUS | Status: AC | PRN
  Administered 2012-03-08: 80 mL via INTRAVENOUS

## 2012-03-08 MED ORDER — METOPROLOL TARTRATE 1 MG/ML IV SOLN
INTRAVENOUS | Status: AC
  Administered 2012-03-08: 5 mg via INTRAVENOUS
  Filled 2012-03-08: qty 15

## 2012-03-08 MED ORDER — NITROGLYCERIN 0.4 MG SL SUBL: 0.4000 mg | SUBLINGUAL_TABLET | SUBLINGUAL | Status: DC | PRN

## 2012-03-08 NOTE — Progress Notes (Signed)
HPI The patient presents for evaluation of chest discomfort. Last week while traveling in [place] she felt very weak. This was episodic. In retrospect she's had increased dizziness and weakness with exercise over the last couple of weeks. She came back into town and on Sunday night she had pain in her jaw and chin. This was 5/10 in intensity. She had never had this before. It was an aching. She was able to go to bed and it eventually went away. Monday while at her job which is not physically exerting she developed chest discomfort along with the jaw discomfort. She has had this on and off since then.  It is moderate in intensity.  She's not describing associated nausea or vomiting. She's not describing new shortness of breath, palpitations, presyncope, PND or orthopnea. She did present to urgent care and was actually treated with nitroglycerin with improvement. She was sent to the emergency room where enzymes were negative and there were no acute findings on EKG though she had poor anterior R wave progression. It was suggested that the patient be admitted and have a CT angiogram. However, she declined. Since then she has continued to have this discomfort off and on. He has tried to ride her exercise bike but has noticed significant exercise limitation. Her husband finally convinced her to come to the cardiologist.  Of note I reviewed the hospital records and she left the ER AMA.  Allergies  Allergen Reactions  . Penicillins Other (See Comments)    Gi upset   . Sulfonamide Derivatives Hives and Itching    Current Outpatient Prescriptions  Medication Sig Dispense Refill  . B Complex-C (B-COMPLEX WITH VITAMIN C) tablet Take 1 tablet by mouth daily.      . calcium gluconate 500 MG tablet Take 500 mg by mouth daily.      . Glucosamine-Chondroitin (OSTEO BI-FLEX REGULAR STRENGTH) 250-200 MG TABS Take 1 tablet by mouth daily.      . Multiple Vitamin (MULITIVITAMIN WITH MINERALS) TABS Take 1 tablet by  mouth daily.      Gaylord Shih TRI-CYCLEN LO 0.18/0.215/0.25 MG-25 MCG TABS       . PATADAY 0.2 % SOLN       . vitamin E 400 UNIT capsule Take 400 Units by mouth daily.       Current Facility-Administered Medications  Medication Dose Route Frequency Provider Last Rate Last Dose  . nitroGLYCERIN (NITROSTAT) SL tablet 0.3 mg  0.3 mg Sublingual Q5 Min x 3 PRN Chelle S Jeffery, PA-C   0.3 mg at 03/05/12 1617    Past Medical History  Diagnosis Date  . Seasonal rhinitis     Past Surgical History  Procedure Date  . Bunionectomy 02/2009    Family History  Problem Relation Age of Onset  . Heart disease Father 60  . Hypertension Mother   . Cancer Mother 67    Ovarian    History   Social History  . Marital Status: Married    Spouse Name: N/A    Number of Children: N/A  . Years of Education: N/A   Occupational History  . Financial analyist    Social History Main Topics  . Smoking status: Never Smoker   . Smokeless tobacco: Not on file  . Alcohol Use: Not on file  . Drug Use: Not on file  . Sexually Active: Not on file   Other Topics Concern  . Not on file   Social History Narrative   Lives at home with  husband.    ROS:  As stated in the HPI and negative for all other systems.  PHYSICAL EXAM BP 110/80  Pulse 70  Ht 5\' 5"  (1.651 m)  Wt 130 lb (58.968 kg)  BMI 21.63 kg/m2  LMP 02/29/2012 GENERAL:  Well appearing HEENT:  Pupils equal round and reactive, fundi not visualized, oral mucosa unremarkable NECK:  No jugular venous distention, waveform within normal limits, carotid upstroke brisk and symmetric, no bruits, no thyromegaly LYMPHATICS:  No cervical, inguinal adenopathy LUNGS:  Clear to auscultation bilaterally BACK:  No CVA tenderness CHEST:  Unremarkable HEART:  PMI not displaced or sustained,S1 and S2 within normal limits, no S3, no S4, no clicks, no rubs, no murmurs ABD:  Flat, positive bowel sounds normal in frequency in pitch, no bruits, no rebound, no  guarding, no midline pulsatile mass, no hepatomegaly, no splenomegaly EXT:  2 plus pulses throughout, no edema, no cyanosis no clubbing SKIN:  No rashes no nodules NEURO:  Cranial nerves II through XII grossly intact, motor grossly intact throughout PSYCH:  Cognitively intact, oriented to person place and time  EKG:  Sinus rhythm, rate 74, axis within normal limits, intervals within normal limits, no acute ST-T wave changes. Poor anterior R wave progression. Cannot exclude old anteroseptal MI  03/05/2012   ASSESSMENT AND PLAN

## 2012-03-08 NOTE — Patient Instructions (Signed)
The current medical regimen is effective;  continue present plan and medications.  Non-Cardiac CT Angiography (CTA), is a special type of CT scan that uses a computer to produce multi-dimensional views of major blood vessels throughout the body. In CT angiography, a contrast material is injected through an IV to help visualize the blood vessels. Please do not eat or drink anything before the procedure.  Please report to Radiology Department at [facility] today at 12N.  Follow up will be based on results

## 2012-03-08 NOTE — Telephone Encounter (Signed)
Pt to have Cardiac CTA@[facility] today@2  p.m.  [employer] has denied prior authorization for test.  Dr. Antoine Poche conducted MD peer review with [facility] precert company, [employer] MD - still denied.  Spoke with both pt and pt's husband about [employer] denial.   Quoted approximately $1800.00 for self pay price.  They are both in agreement to be self pay.

## 2012-03-08 NOTE — Telephone Encounter (Signed)
LMOM to CB. 

## 2012-03-08 NOTE — Assessment & Plan Note (Addendum)
Chest pain is as described. I think the pretest probability of obstructive coronary disease is high. In this situation I think the possibility of spontaneous coronary dissection should be considered. Given this stress testing would be high risk. In addition there is some risk for cardiac catheterization as the first test in this patient. CT coronary angiography is indicated and appropriate. I agree that she should have stayed to have thiswhen she was in the emergency room recently. She agrees to have it done today. Further treatment will be based on this result.  Of note she understands that this procedure will not be pre approved by her insurance.

## 2012-03-08 NOTE — Telephone Encounter (Signed)
Please call patient again

## 2012-03-09 NOTE — Telephone Encounter (Signed)
LMOM TO CB 

## 2012-03-10 NOTE — Telephone Encounter (Signed)
LMOM TO CB 

## 2012-03-11 NOTE — Telephone Encounter (Signed)
Dr. Jenene Slicker notes reviewed.

## 2012-03-11 NOTE — Progress Notes (Signed)
Addended by: Judithe Modest D on: 03/11/2012 05:26 PM   Modules accepted: Orders

## 2012-03-11 NOTE — Progress Notes (Signed)
Observation review is complete for 3/19 visit.

## 2012-03-11 NOTE — Telephone Encounter (Signed)
LMOM at H and C #s to CB. Sent Unable to Reach letter with information from [place], and that we see in the records that she saw Dr Antoine Poche on 3/22. Chelle, I'm forwarding this back FYI - see OV and telephone notes in Epic from Dr Antoine Poche.

## 2012-03-28 ENCOUNTER — Ambulatory Visit (INDEPENDENT_AMBULATORY_CARE_PROVIDER_SITE_OTHER): Payer: BC Managed Care – PPO | Admitting: Cardiology

## 2012-03-28 ENCOUNTER — Encounter: Payer: Self-pay | Admitting: Cardiology

## 2012-03-28 DIAGNOSIS — R079 Chest pain, unspecified: Secondary | ICD-10-CM

## 2012-03-28 DIAGNOSIS — E785 Hyperlipidemia, unspecified: Secondary | ICD-10-CM

## 2012-03-28 NOTE — Assessment & Plan Note (Signed)
No further cardiovascular testing is suggested. She will continue with primary risk reduction.

## 2012-03-28 NOTE — Patient Instructions (Signed)
The current medical regimen is effective;  continue present plan and medications.  Follow up as needed 

## 2012-03-28 NOTE — Assessment & Plan Note (Signed)
She has had some mild dyslipidemia in the past he will followup with her primary provider.

## 2012-03-28 NOTE — Progress Notes (Signed)
   HPI The patient presents for evaluation of chest discomfort. This is extensively described in a previous note. Her symptoms were consistent with unstable angina. However, CT angiography demonstrated only minimal 30% diagonal plaque. She has since had resolution of her symptoms. She slowly got back her bicycle and has had no recurrent chest discomfort dizziness or shortness of breath. She's not back up to the exercise level that she was.  Allergies  Allergen Reactions  . Penicillins Other (See Comments)    Gi upset   . Sulfonamide Derivatives Hives and Itching    Current Outpatient Prescriptions  Medication Sig Dispense Refill  . B Complex-C (B-COMPLEX WITH VITAMIN C) tablet Take 1 tablet by mouth daily.      . calcium gluconate 500 MG tablet Take 500 mg by mouth daily.      . Glucosamine-Chondroitin (OSTEO BI-FLEX REGULAR STRENGTH) 250-200 MG TABS Take 1 tablet by mouth daily.      . Multiple Vitamin (MULITIVITAMIN WITH MINERALS) TABS Take 1 tablet by mouth daily.      . nitroGLYCERIN (NITROSTAT) 0.4 MG SL tablet Place 1 tablet (0.4 mg total) under the tongue every 5 (five) minutes as needed for chest pain.  25 tablet  3  . ORTHO TRI-CYCLEN LO 0.18/0.215/0.25 MG-25 MCG TABS       . PATADAY 0.2 % SOLN       . vitamin E 400 UNIT capsule Take 400 Units by mouth daily.      Marland Kitchen DISCONTD: gabapentin (NEURONTIN) 300 MG capsule        Current Facility-Administered Medications  Medication Dose Route Frequency Provider Last Rate Last Dose  . nitroGLYCERIN (NITROSTAT) SL tablet 0.3 mg  0.3 mg Sublingual Q5 Min x 3 PRN Chelle S Jeffery, PA-C   0.3 mg at 03/05/12 1617    Past Medical History  Diagnosis Date  . Seasonal rhinitis     Past Surgical History  Procedure Date  . Bunionectomy 02/2009    ROS:  As stated in the HPI and negative for all other systems.  PHYSICAL EXAM BP 120/80  Pulse 60  Ht 5\' 4"  (1.626 m)  Wt 128 lb (58.06 kg)  BMI 21.97 kg/m2  LMP 02/29/2012 GENERAL:  Well  appearing LUNGS:  Clear to auscultation bilaterally HEART:  PMI not displaced or sustained,S1 and S2 within normal limits, no S3, no S4, no clicks, no rubs, no murmurs ABD:  Flat, positive bowel sounds normal in frequency in pitch, no bruits, no rebound, no guarding, no midline pulsatile mass, no hepatomegaly, no splenomegaly EXT:  2 plus pulses throughout, no edema, no cyanosis no clubbing   ASSESSMENT AND PLAN

## 2012-04-03 ENCOUNTER — Institutional Professional Consult (permissible substitution): Payer: BC Managed Care – PPO | Admitting: Cardiovascular Disease

## 2012-05-20 ENCOUNTER — Ambulatory Visit (INDEPENDENT_AMBULATORY_CARE_PROVIDER_SITE_OTHER): Payer: BC Managed Care – PPO | Admitting: Emergency Medicine

## 2012-05-20 VITALS — BP 155/93 | HR 83 | Temp 98.5°F | Resp 16 | Ht 64.25 in | Wt 124.6 lb

## 2012-05-20 DIAGNOSIS — H8110 Benign paroxysmal vertigo, unspecified ear: Secondary | ICD-10-CM

## 2012-05-20 MED ORDER — MECLIZINE HCL 32 MG PO TABS: 32.0000 mg | ORAL_TABLET | Freq: Three times a day (TID) | ORAL | Status: AC | PRN

## 2012-05-20 NOTE — Patient Instructions (Signed)

## 2012-05-20 NOTE — Progress Notes (Signed)
  Subjective:    Patient ID: Amber Brennan, female    DOB: [birth year], 51 y.o.   MRN: [id number]  HPI Comments: No history of injury.  No headache or neurological or visual symptoms.  Sudden onset of BPV 1 week ago with no provocative factors.  No improvement during interval.  No other neuro or visual symptoms.     Review of Systems  Constitutional: Negative.   HENT: Negative.   Eyes: Negative.   Cardiovascular: Negative.   Gastrointestinal: Negative.   Genitourinary: Negative.   Musculoskeletal: Negative.   Neurological: Positive for dizziness. Negative for tremors, seizures, syncope, facial asymmetry, speech difficulty, weakness, light-headedness, numbness and headaches.  Psychiatric/Behavioral: Negative.        Objective:   Physical Exam  Constitutional: She is oriented to person, place, and time. She appears well-developed and well-nourished. No distress.  HENT:  Head: Normocephalic and atraumatic.  Right Ear: External ear normal.  Left Ear: External ear normal.  Nose: Nose normal.  Mouth/Throat: Oropharynx is clear and moist.  Eyes: Conjunctivae are normal. Pupils are equal, round, and reactive to light.  Neck: Normal range of motion. Neck supple.  Cardiovascular: Normal rate, regular rhythm and normal heart sounds.   Pulmonary/Chest: Effort normal and breath sounds normal.  Abdominal: Soft.  Musculoskeletal: Normal range of motion.  Neurological: She is alert and oriented to person, place, and time. She exhibits normal muscle tone. Coordination normal.  Skin: Skin is warm and dry. She is not diaphoretic.          Assessment & Plan:  Discussed differential diagnosis with patient and will start on meclizine and make referal to ENT

## 2012-05-23 ENCOUNTER — Ambulatory Visit: Payer: BC Managed Care – PPO | Admitting: Family Medicine

## 2013-02-04 IMAGING — CR DG CHEST 2V
2 series · 2 of 2 positions shown · non-contrast
Comparison: None

CLINICAL DATA: Mid chest and right jaw pain

CHEST - 2 VIEW

[w chest pa]
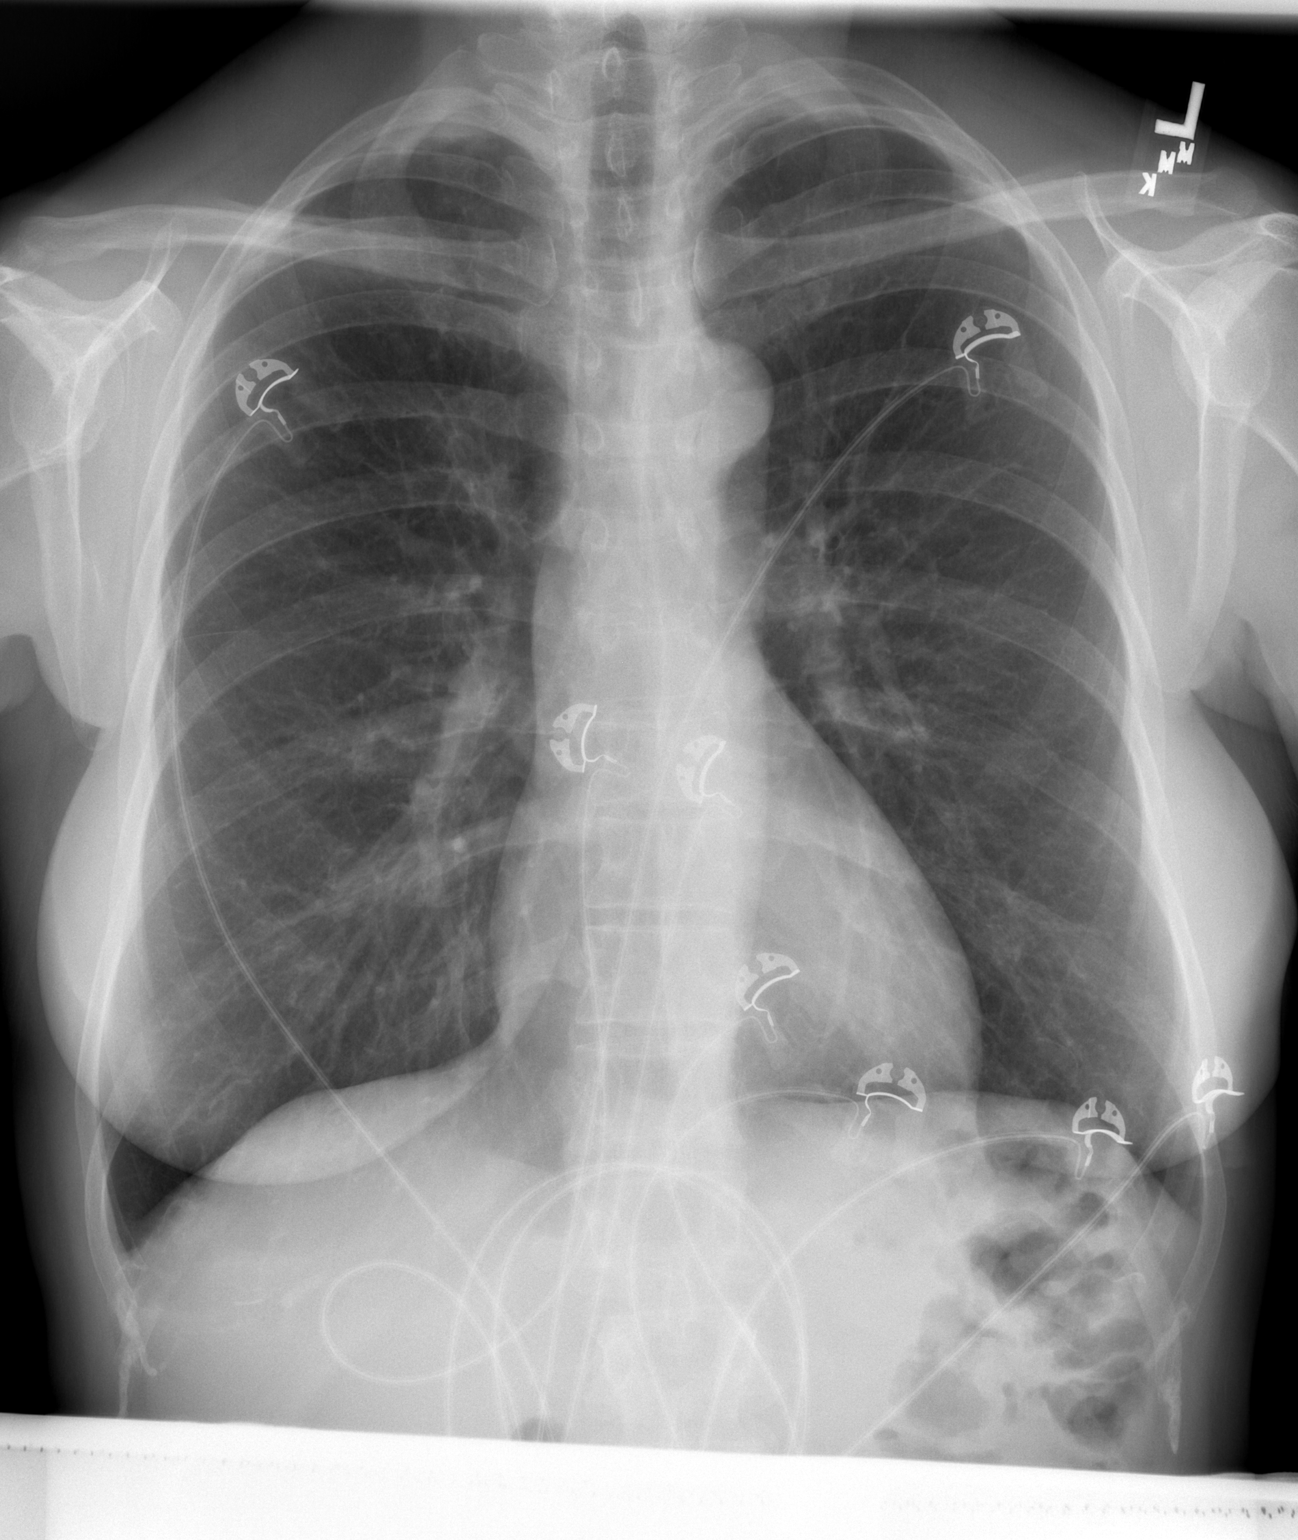

[w chest lat]
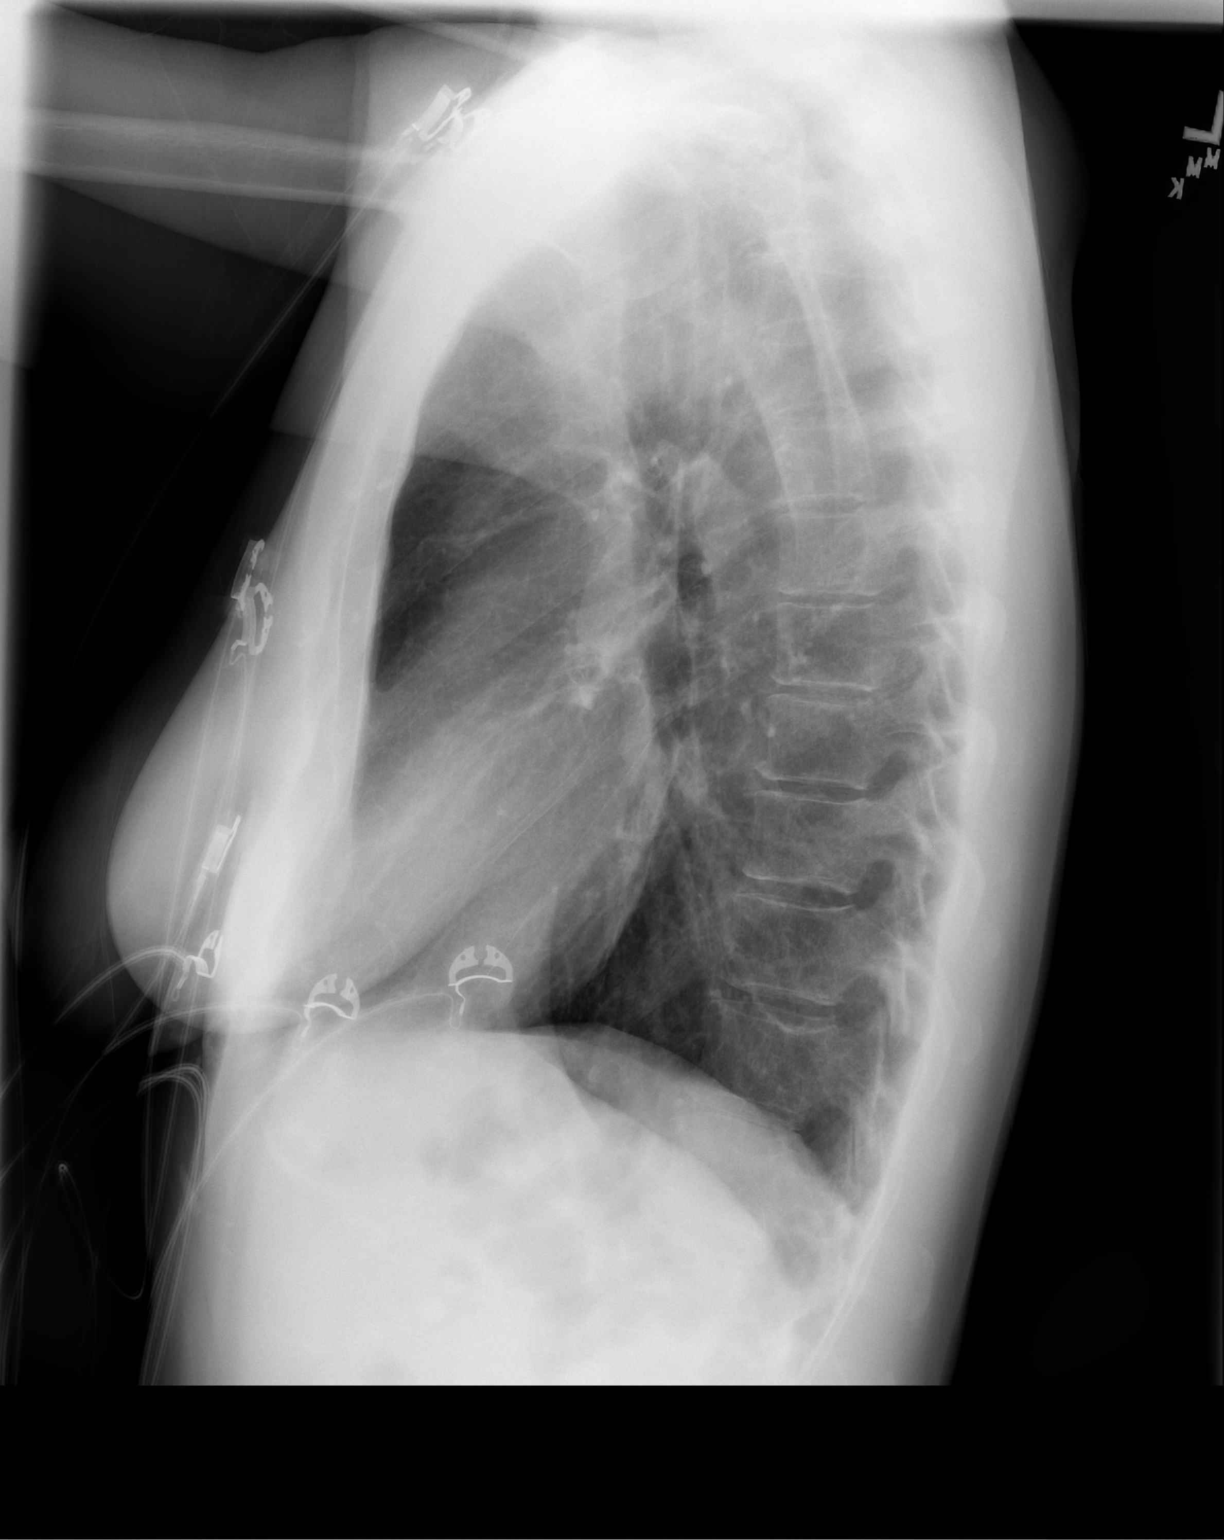

[2 of 2 positions shown; findings below may reference images not displayed]

FINDINGS: Normal heart size, mediastinal contours, and pulmonary vascularity.
Lungs appear minimally hyperexpanded clear.
No pleural effusion or pneumothorax.
No acute osseous findings.
IMPRESSION: No acute abnormalities.

## 2013-02-26 ENCOUNTER — Ambulatory Visit: Payer: BC Managed Care – PPO

## 2013-02-26 ENCOUNTER — Encounter: Payer: Self-pay | Admitting: Family Medicine

## 2013-02-26 ENCOUNTER — Ambulatory Visit (INDEPENDENT_AMBULATORY_CARE_PROVIDER_SITE_OTHER): Payer: BC Managed Care – PPO | Admitting: Family Medicine

## 2013-02-26 VITALS — BP 110/70 | HR 77 | Temp 97.8°F | Resp 16 | Ht 64.25 in | Wt 127.0 lb

## 2013-02-26 DIAGNOSIS — R059 Cough, unspecified: Secondary | ICD-10-CM

## 2013-02-26 DIAGNOSIS — J309 Allergic rhinitis, unspecified: Secondary | ICD-10-CM

## 2013-02-26 DIAGNOSIS — M25511 Pain in right shoulder: Secondary | ICD-10-CM

## 2013-02-26 DIAGNOSIS — R5381 Other malaise: Secondary | ICD-10-CM

## 2013-02-26 DIAGNOSIS — M899 Disorder of bone, unspecified: Secondary | ICD-10-CM

## 2013-02-26 DIAGNOSIS — M949 Disorder of cartilage, unspecified: Secondary | ICD-10-CM

## 2013-02-26 DIAGNOSIS — R05 Cough: Secondary | ICD-10-CM

## 2013-02-26 LAB — CBC WITH DIFFERENTIAL/PLATELET
Basophils Absolute: 0 K/uL (ref 0.0–0.1)
Basophils Relative: 1 % (ref 0–1)
Eosinophils Absolute: 0.1 K/uL (ref 0.0–0.7)
Eosinophils Relative: 1 % (ref 0–5)
HCT: 40.3 % (ref 36.0–46.0)
Hemoglobin: 13.5 g/dL (ref 12.0–15.0)
Lymphocytes Relative: 36 % (ref 12–46)
Lymphs Abs: 1.6 K/uL (ref 0.7–4.0)
MCH: 30.8 pg (ref 26.0–34.0)
MCHC: 33.5 g/dL (ref 30.0–36.0)
MCV: 91.8 fL (ref 78.0–100.0)
Monocytes Absolute: 0.4 K/uL (ref 0.1–1.0)
Monocytes Relative: 9 % (ref 3–12)
Neutro Abs: 2.3 K/uL (ref 1.7–7.7)
Neutrophils Relative %: 53 % (ref 43–77)
Platelets: 303 K/uL (ref 150–400)
RBC: 4.39 MIL/uL (ref 3.87–5.11)
RDW: 13.2 % (ref 11.5–15.5)
WBC: 4.4 K/uL (ref 4.0–10.5)

## 2013-02-26 MED ORDER — MONTELUKAST SODIUM 10 MG PO TABS: 10.0000 mg | ORAL_TABLET | Freq: Every day | ORAL | Status: DC

## 2013-02-26 NOTE — Patient Instructions (Addendum)

## 2013-02-26 NOTE — Progress Notes (Addendum)
Subjective:    Patient ID: Amber Brennan, female    DOB: 1961-04-04, 52 y.o.   MRN: [id number]  HPI This 52 y.o. Cauc female is in good health but reports 6-week hx of R-sided neck discomfort   with nonprod cough and fatigue. Pt also has soreness and swollen area involving R clavicle. The   feeling of fatigue seems to have sudden onset after pt has been very active; most recently, it was  after an evening out w/ friends at a wine bar. She went to bed and slept well; she felt normal the  next morning.    Pt has a hx of seasonal allergies; she had testing at Allergist's office > 2 years ago. She has eye  symptoms predominantly but also has a sore throat "like when you are getting ready to come down  with something". She takes no antihistamine but Singulair taken in 2011 "worked wonders".   Pt has cats and a dog which she walks daily. No significant exposure to illness at work.   There is Family Hx of thyroid disease.  Review of Systems  Constitutional: Positive for fatigue. Negative for fever and unexpected weight change.  HENT: Positive for congestion, sore throat, neck pain, voice change and postnasal drip. Negative for hearing loss, ear pain, nosebleeds, facial swelling, rhinorrhea, trouble swallowing and neck stiffness.        Clears throat a lot.  Eyes: Positive for redness and itching. Negative for visual disturbance.  Respiratory: Positive for cough. Negative for chest tightness, shortness of breath and wheezing.   Cardiovascular: Negative.   Skin: Negative.   Allergic/Immunologic: Positive for environmental allergies. Negative for food allergies and immunocompromised state.  Neurological: Negative.   Hematological: Negative.   Psychiatric/Behavioral: Negative.        Objective:   Physical Exam  Nursing note and vitals reviewed. Constitutional: She is oriented to person, place, and time. She appears well-developed and well-nourished. No distress.  HENT:  Head: Normocephalic  and atraumatic.  Right Ear: Hearing, external ear and ear canal normal. No drainage or tenderness. No mastoid tenderness. Tympanic membrane is scarred. Tympanic membrane is not injected, not perforated, not erythematous, not retracted and not bulging. No middle ear effusion.  Left Ear: Hearing, external ear and ear canal normal. No drainage or tenderness. No mastoid tenderness. Tympanic membrane is scarred. Tympanic membrane is not injected, not perforated, not erythematous, not retracted and not bulging. A middle ear effusion is present.  Nose: No mucosal edema, rhinorrhea, nasal deformity or septal deviation. Right sinus exhibits no maxillary sinus tenderness and no frontal sinus tenderness. Left sinus exhibits no maxillary sinus tenderness and no frontal sinus tenderness.  Mouth/Throat: Uvula is midline and mucous membranes are normal. No oral lesions. Normal dentition. No edematous or dental caries. Posterior oropharyngeal erythema present. No oropharyngeal exudate or posterior oropharyngeal edema.  Eyes: EOM and lids are normal. Pupils are equal, round, and reactive to light. Right eye exhibits no discharge and no exudate. Left eye exhibits no discharge and no exudate. Right conjunctiva is injected. Left conjunctiva is injected. No scleral icterus.  Neck: Normal range of motion. Neck supple. No tracheal deviation present. No thyromegaly present.  Cardiovascular: Normal rate, regular rhythm and normal heart sounds.  Exam reveals no gallop and no friction rub.   No murmur heard. Pulmonary/Chest: Effort normal and breath sounds normal. No respiratory distress. She has no wheezes.  Musculoskeletal: Normal range of motion. She exhibits tenderness. She exhibits no edema.  R clavicle- swollen and  tender medial to midpoint.  Lymphadenopathy:       Head (right side): Tonsillar adenopathy present. No submandibular, no preauricular, no posterior auricular and no occipital adenopathy present.       Head (left  side): Tonsillar adenopathy present. No submandibular, no preauricular, no posterior auricular and no occipital adenopathy present.    She has no cervical adenopathy.       Right cervical: No posterior cervical adenopathy present.      Left cervical: No posterior cervical adenopathy present.  Neurological: She is alert and oriented to person, place, and time. No cranial nerve deficit. She exhibits normal muscle tone. Coordination normal.  Skin: Skin is warm and dry. No rash noted. No pallor.  Psychiatric: She has a normal mood and affect. Her behavior is normal. Judgment and thought content normal.     [facility] reading (PRIMARY) by  Dr. Audria Nine: CXR- Lung fields clear w/o active disease and cardiac size normal. T-spine w/ deg changes.       Assessment & Plan:  Other malaise and fatigue - Plan: CBC with Differential, TSH, T3, Free, Comprehensive metabolic panel, DG Chest 2 View  Clavicle pain, right - Plan: Sedimentation Rate, DG Chest 2 View  Cough - suspect allergies/ reactive airways   Plan: DG Chest 2 View  Allergic rhinitis- Rx: Montelukast (Singulair) 10 mg  1 tablet at bedtime.

## 2013-02-27 LAB — COMPREHENSIVE METABOLIC PANEL
AST: 19 U/L (ref 0–37)
Albumin: 4.2 g/dL (ref 3.5–5.2)
BUN: 12 mg/dL (ref 6–23)
CO2: 28 mEq/L (ref 19–32)
Calcium: 9.8 mg/dL (ref 8.4–10.5)
Chloride: 105 mEq/L (ref 96–112)
Creat: 0.86 mg/dL (ref 0.50–1.10)
Glucose, Bld: 86 mg/dL (ref 70–99)

## 2013-02-27 LAB — TSH: TSH: 1.242 u[IU]/mL (ref 0.350–4.500)

## 2013-02-27 NOTE — Progress Notes (Signed)
Quick Note:  Please notify pt that results are normal.   Provide pt with copy of labs. ______ 

## 2013-03-26 ENCOUNTER — Ambulatory Visit: Payer: BC Managed Care – PPO | Admitting: Family Medicine

## 2013-06-29 ENCOUNTER — Other Ambulatory Visit: Payer: Self-pay | Admitting: Family Medicine

## 2013-10-23 ENCOUNTER — Other Ambulatory Visit: Payer: Self-pay

## 2013-12-08 ENCOUNTER — Ambulatory Visit (INDEPENDENT_AMBULATORY_CARE_PROVIDER_SITE_OTHER): Payer: BC Managed Care – PPO | Admitting: Emergency Medicine

## 2013-12-08 VITALS — BP 128/84 | HR 98 | Temp 98.9°F | Resp 16 | Ht 64.0 in | Wt 130.2 lb

## 2013-12-08 DIAGNOSIS — J209 Acute bronchitis, unspecified: Secondary | ICD-10-CM

## 2013-12-08 DIAGNOSIS — J018 Other acute sinusitis: Secondary | ICD-10-CM

## 2013-12-08 MED ORDER — PROMETHAZINE-CODEINE 6.25-10 MG/5ML PO SYRP: 5.0000 mL | ORAL_SOLUTION | Freq: Four times a day (QID) | ORAL | Status: DC | PRN

## 2013-12-08 MED ORDER — LEVOFLOXACIN 500 MG PO TABS: 500.0000 mg | ORAL_TABLET | Freq: Every day | ORAL | Status: AC

## 2013-12-08 MED ORDER — PSEUDOEPHEDRINE-GUAIFENESIN ER 60-600 MG PO TB12: 1.0000 | ORAL_TABLET | Freq: Two times a day (BID) | ORAL | Status: DC

## 2013-12-08 NOTE — Patient Instructions (Signed)
Sinusitis Sinusitis is redness, soreness, and swelling (inflammation) of the paranasal sinuses. Paranasal sinuses are air pockets within the bones of your face (beneath the eyes, the middle of the forehead, or above the eyes). In healthy paranasal sinuses, mucus is able to drain out, and air is able to circulate through them by way of your nose. However, when your paranasal sinuses are inflamed, mucus and air can become trapped. This can allow bacteria and other germs to grow and cause infection. Sinusitis can develop quickly and last only a short time (acute) or continue over a long period (chronic). Sinusitis that lasts for more than 12 weeks is considered chronic.  CAUSES  Causes of sinusitis include:  Allergies.  Structural abnormalities, such as displacement of the cartilage that separates your nostrils (deviated septum), which can decrease the air flow through your nose and sinuses and affect sinus drainage.  Functional abnormalities, such as when the small hairs (cilia) that line your sinuses and help remove mucus do not work properly or are not present. SYMPTOMS  Symptoms of acute and chronic sinusitis are the same. The primary symptoms are pain and pressure around the affected sinuses. Other symptoms include:  Upper toothache.  Earache.  Headache.  Bad breath.  Decreased sense of smell and taste.  A cough, which worsens when you are lying flat.  Fatigue.  Fever.  Thick drainage from your nose, which often is green and may contain pus (purulent).  Swelling and warmth over the affected sinuses. DIAGNOSIS  Your caregiver will perform a physical exam. During the exam, your caregiver may:  Look in your nose for signs of abnormal growths in your nostrils (nasal polyps).  Tap over the affected sinus to check for signs of infection.  View the inside of your sinuses (endoscopy) with a special imaging device with a light attached (endoscope), which is inserted into your  sinuses. If your caregiver suspects that you have chronic sinusitis, one or more of the following tests may be recommended:  Allergy tests.  Nasal culture A sample of mucus is taken from your nose and sent to a lab and screened for bacteria.  Nasal cytology A sample of mucus is taken from your nose and examined by your caregiver to determine if your sinusitis is related to an allergy. TREATMENT  Most cases of acute sinusitis are related to a viral infection and will resolve on their own within 10 days. Sometimes medicines are prescribed to help relieve symptoms (pain medicine, decongestants, nasal steroid sprays, or saline sprays).  However, for sinusitis related to a bacterial infection, your caregiver will prescribe antibiotic medicines. These are medicines that will help kill the bacteria causing the infection.  Rarely, sinusitis is caused by a fungal infection. In theses cases, your caregiver will prescribe antifungal medicine. For some cases of chronic sinusitis, surgery is needed. Generally, these are cases in which sinusitis recurs more than 3 times per year, despite other treatments. HOME CARE INSTRUCTIONS   Drink plenty of water. Water helps thin the mucus so your sinuses can drain more easily.  Use a humidifier.  Inhale steam 3 to 4 times a day (for example, sit in the bathroom with the shower running).  Apply a warm, moist washcloth to your face 3 to 4 times a day, or as directed by your caregiver.  Use saline nasal sprays to help moisten and clean your sinuses.  Take over-the-counter or prescription medicines for pain, discomfort, or fever only as directed by your caregiver. SEEK IMMEDIATE MEDICAL   CARE IF:  You have increasing pain or severe headaches.  You have nausea, vomiting, or drowsiness.  You have swelling around your face.  You have vision problems.  You have a stiff neck.  You have difficulty breathing. MAKE SURE YOU:   Understand these  instructions.  Will watch your condition.  Will get help right away if you are not doing well or get worse. Document Released: 12/04/2005 Document Revised: 02/26/2012 Document Reviewed: 12/19/2011 ExitCare Patient Information 2014 [employer].   Bronchitis Bronchitis is the body's way of reacting to injury and/or infection (inflammation) of the bronchi. Bronchi are the air tubes that extend from the windpipe into the lungs. If the inflammation becomes severe, it may cause shortness of breath. CAUSES  Inflammation may be caused by:  A virus.  Germs (bacteria).  Dust.  Allergens.  Pollutants and many other irritants. The cells lining the bronchial tree are covered with tiny hairs (cilia). These constantly beat upward, away from the lungs, toward the mouth. This keeps the lungs free of pollutants. When these cells become too irritated and are unable to do their job, mucus begins to develop. This causes the characteristic cough of bronchitis. The cough clears the lungs when the cilia are unable to do their job. Without either of these protective mechanisms, the mucus would settle in the lungs. Then you would develop pneumonia. Smoking is a common cause of bronchitis and can contribute to pneumonia. Stopping this habit is the single most important thing you can do to help yourself. TREATMENT   Your caregiver may prescribe an antibiotic if the cough is caused by bacteria. Also, medicines that open up your airways make it easier to breathe. Your caregiver may also recommend or prescribe an expectorant. It will loosen the mucus to be coughed up. Only take over-the-counter or prescription medicines for pain, discomfort, or fever as directed by your caregiver.  Removing whatever causes the problem (smoking, for example) is critical to preventing the problem from getting worse.  Cough suppressants may be prescribed for relief of cough symptoms.  Inhaled medicines may be prescribed to help  with symptoms now and to help prevent problems from returning.  For those with recurrent (chronic) bronchitis, there may be a need for steroid medicines. SEEK IMMEDIATE MEDICAL CARE IF:   During treatment, you develop more pus-like mucus (purulent sputum).  You have a fever.  You become progressively more ill.  You have increased difficulty breathing, wheezing, or shortness of breath. It is necessary to seek immediate medical care if you are elderly or sick from any other disease. MAKE SURE YOU:   Understand these instructions.  Will watch your condition.  Will get help right away if you are not doing well or get worse. Document Released: 12/04/2005 Document Revised: 08/06/2013 Document Reviewed: 07/29/2013 ExitCare Patient Information 2014 [employer].  

## 2013-12-08 NOTE — Progress Notes (Signed)
[facility] [address], [place] [address] [phone]  Date:  12/08/2013   Name:  Amber Brennan   DOB:  Oct 26, 1961   MRN:  [id number]  PCP:  Juluis Mire, MD    Chief Complaint: Sinusitis, Facial Pain and Cough   History of Present Illness:  Amber Brennan is a 52 y.o. very pleasant female patient who presents with the following:  Ill since Friday with nasal congestion, post nasal drip and cough. Productive purulent drainage and scant sputum.  No wheezing or shortness of breath.  No wheezing or shortness of breath.  No nausea or vomiting.  No rash. No improvement with over the counter medications or other home remedies. Denies other complaint or health concern today.   Patient Active Problem List   Diagnosis Date Noted  . Dyslipidemia 03/28/2012  . Chest pain 03/08/2012  . Rotator cuff tear, right 03/07/2011  . DJD (degenerative joint disease) of cervical spine 03/07/2011  . SHOULDER PAIN, RIGHT 02/14/2011  . MUSCLE SPASM, TRAPEZIUS MUSCLE, LEFT 02/14/2011  . BUNION, RIGHT FOOT 02/28/2010  . FOOT PAIN, RIGHT 02/28/2010  . UNEQUAL LEG LENGTH 02/28/2010    Past Medical History  Diagnosis Date  . Seasonal rhinitis     Past Surgical History  Procedure Laterality Date  . Bunionectomy  02/2009    History  Substance Use Topics  . Smoking status: Never Smoker   . Smokeless tobacco: Not on file  . Alcohol Use: No    Family History  Problem Relation Age of Onset  . Heart disease Father 28  . Hypertension Mother   . Cancer Mother 27    Ovarian    Allergies  Allergen Reactions  . Penicillins Other (See Comments)    Gi upset   . Sulfonamide Derivatives Hives and Itching    Medication list has been reviewed and updated.  Current Outpatient Prescriptions on File Prior to Visit  Medication Sig Dispense Refill  . B Complex-C (B-COMPLEX WITH VITAMIN C) tablet Take 1 tablet by mouth daily.      . calcium gluconate 500 MG tablet Take 500 mg  by mouth daily.      . Glucosamine-Chondroitin (OSTEO BI-FLEX REGULAR STRENGTH) 250-200 MG TABS Take 1 tablet by mouth daily.      . montelukast (SINGULAIR) 10 MG tablet TAKE 1 TABLET BY MOUTH EVERY NIGHT AT BEDTIME  30 tablet  2  . Multiple Vitamin (MULITIVITAMIN WITH MINERALS) TABS Take 1 tablet by mouth daily.      Gaylord Shih TRI-CYCLEN LO 0.18/0.215/0.25 MG-25 MCG TABS       . PATADAY 0.2 % SOLN       . nitroGLYCERIN (NITROSTAT) 0.4 MG SL tablet Place 1 tablet (0.4 mg total) under the tongue every 5 (five) minutes as needed for chest pain.  25 tablet  3  . vitamin E 400 UNIT capsule Take 400 Units by mouth daily.      . [DISCONTINUED] gabapentin (NEURONTIN) 300 MG capsule        No current facility-administered medications on file prior to visit.    Review of Systems:  As per HPI, otherwise negative.    Physical Examination: Filed Vitals:   12/08/13 1554  BP: 128/84  Pulse: 98  Temp: 98.9 F (37.2 C)  Resp: 16   Filed Vitals:   12/08/13 1554  Height: 5\' 4"  (1.626 m)  Weight: 130 lb 3.2 oz (59.058 kg)   Body mass index is 22.34 kg/(m^2). Ideal Body Weight:  Weight in (lb) to have BMI = 25: 145.3  GEN: WDWN, NAD, Non-toxic, A & O x 3 HEENT: Atraumatic, Normocephalic. Neck supple. No masses, No LAD. Ears and Nose: No external deformity. CV: RRR, No M/G/R. No JVD. No thrill. No extra heart sounds. PULM: CTA B, no wheezes, crackles, rhonchi. No retractions. No resp. distress. No accessory muscle use. ABD: S, NT, ND, +BS. No rebound. No HSM. EXTR: No c/c/e NEURO Normal gait.  PSYCH: Normally interactive. Conversant. Not depressed or anxious appearing.  Calm demeanor.    Assessment and Plan: Sinusitis Bronchitis levaquin mucinex d Phen c cod  Signed,  Phillips Odor, MD

## 2014-05-25 ENCOUNTER — Ambulatory Visit (INDEPENDENT_AMBULATORY_CARE_PROVIDER_SITE_OTHER): Payer: BC Managed Care – PPO | Admitting: Family Medicine

## 2014-05-25 VITALS — BP 116/78 | HR 80 | Temp 98.7°F | Resp 18 | Ht 64.0 in | Wt 129.0 lb

## 2014-05-25 DIAGNOSIS — H01139 Eczematous dermatitis of unspecified eye, unspecified eyelid: Secondary | ICD-10-CM

## 2014-05-25 DIAGNOSIS — L301 Dyshidrosis [pompholyx]: Secondary | ICD-10-CM

## 2014-05-25 MED ORDER — TRIAMCINOLONE ACETONIDE 0.1 % EX CREA
1.0000 | TOPICAL_CREAM | Freq: Two times a day (BID) | CUTANEOUS | Status: DC
Start: 2014-05-25 — End: 2016-07-12

## 2014-05-25 NOTE — Patient Instructions (Signed)
Apply the triamcinolone cream to your eyelid crease until those rashes are completely gone and then switch over to good hypoallergenic thick moisturizer cream such as Eucerin, Cedaphil, or Aquaphor and apply this continually twice a day - especially immediately after showering to prevent it from coming back.  General measures for eczema - In clinical experience, avoidance of irritants or exacerbating factors is beneficial for most patients with dyshidrotic eczema. General skin care measures aimed at reducing skin irritation and restoring the skin barrier include:  Using lukewarm water and soap-free cleansers to wash hands  Drying hands thoroughly after washing  Applying emollients (eg, petroleum jelly) immediately after hand drying and as often as possible  Wearing cotton gloves under vinyl or other nonlatex gloves when performing wet work  Removing rings and watches and bracelets before wet work  Wearing protective gloves in cold weather  Wearing task-specific gloves for frictional exposures (eg, gardening, carpentry)  Avoiding exposure to irritants (eg, detergents, solvents, hair lotions or dyes, acidic foods [eg, citrus fruit])  In clinical practice, astringent solutions such as aluminum subacetate (Burow's solution) or witch hazel are used for wet, weeping skin. Hands or feet are soaked in the solution for 15 minutes two to four times per day.

## 2014-08-06 NOTE — Progress Notes (Signed)
Subjective:    Patient ID: Amber Brennan, female    DOB: 1961/05/15, 53 y.o.   MRN: [id number] Chief Complaint  Patient presents with  . Rash    itchy on both eyelids x1 week     HPI  Past Medical History  Diagnosis Date  . Seasonal rhinitis    Current Outpatient Prescriptions on File Prior to Visit  Medication Sig Dispense Refill  . B Complex-C (B-COMPLEX WITH VITAMIN C) tablet Take 1 tablet by mouth daily.      . calcium gluconate 500 MG tablet Take 500 mg by mouth daily.      . Glucosamine-Chondroitin (OSTEO BI-FLEX REGULAR STRENGTH) 250-200 MG TABS Take 1 tablet by mouth daily.      . montelukast (SINGULAIR) 10 MG tablet TAKE 1 TABLET BY MOUTH EVERY NIGHT AT BEDTIME  30 tablet  2  . Multiple Vitamin (MULITIVITAMIN WITH MINERALS) TABS Take 1 tablet by mouth daily.      Gaylord Shih TRI-CYCLEN LO 0.18/0.215/0.25 MG-25 MCG TABS       . [DISCONTINUED] gabapentin (NEURONTIN) 300 MG capsule        No current facility-administered medications on file prior to visit.   Allergies  Allergen Reactions  . Penicillins Other (See Comments)    Gi upset   . Sulfonamide Derivatives Hives and Itching     Review of Systems  Constitutional: Positive for diaphoresis. Negative for fever, chills, activity change, appetite change and fatigue.  HENT: Negative for congestion, ear discharge, ear pain, nosebleeds, postnasal drip, rhinorrhea, sinus pressure, trouble swallowing and voice change.   Eyes: Positive for itching. Negative for photophobia, pain, discharge, redness and visual disturbance.  Respiratory: Negative for cough and shortness of breath.   Cardiovascular: Negative for chest pain.  Gastrointestinal: Negative for nausea, vomiting and abdominal pain.  Musculoskeletal: Negative for neck pain and neck stiffness.  Skin: Positive for rash.  Neurological: Negative for dizziness, syncope and headaches.  Hematological: Negative for adenopathy.  Psychiatric/Behavioral: Negative for sleep  disturbance.       Objective:  BP 116/78  Pulse 80  Temp(Src) 98.7 F (37.1 C) (Oral)  Resp 18  Ht 5\' 4"  (1.626 m)  Wt 129 lb (58.514 kg)  BMI 22.13 kg/m2  SpO2 99%  LMP 05/08/2014  Physical Exam  Constitutional: She is oriented to person, place, and time. She appears well-developed and well-nourished. No distress.  HENT:  Head: Normocephalic and atraumatic.  Right Ear: Tympanic membrane, external ear and ear canal normal.  Left Ear: Tympanic membrane, external ear and ear canal normal.  Nose: Nose normal. No mucosal edema or rhinorrhea.  Mouth/Throat: Uvula is midline, oropharynx is clear and moist and mucous membranes are normal. No oropharyngeal exudate.  Eyes: Conjunctivae are normal. Right eye exhibits no discharge. Left eye exhibits no discharge. No scleral icterus.  Neck: Normal range of motion. Neck supple.  Cardiovascular: Normal rate, regular rhythm, normal heart sounds and intact distal pulses.   Pulmonary/Chest: Effort normal and breath sounds normal.  Lymphadenopathy:    She has no cervical adenopathy.  Neurological: She is alert and oriented to person, place, and time.  Skin: Skin is warm and dry. She is not diaphoretic. No erythema.  Psychiatric: She has a normal mood and affect. Her behavior is normal.          Assessment & Plan:   Atopic dermatitis of eyelid  Dyshydrosis  Meds ordered this encounter  Medications  . triamcinolone cream (KENALOG) 0.1 %    Sig:  Apply 1 application topically 2 (two) times daily.    Dispense:  30 g    Refill:  0     Norberto SorensonEva Shaw, MD MPH

## 2016-05-02 DIAGNOSIS — H5213 Myopia, bilateral: Secondary | ICD-10-CM | POA: Diagnosis not present

## 2016-07-12 ENCOUNTER — Ambulatory Visit (INDEPENDENT_AMBULATORY_CARE_PROVIDER_SITE_OTHER): Payer: BLUE CROSS/BLUE SHIELD | Admitting: Family Medicine

## 2016-07-12 VITALS — BP 112/74 | HR 96 | Temp 98.4°F | Resp 18 | Ht 64.0 in | Wt 129.0 lb

## 2016-07-12 DIAGNOSIS — R21 Rash and other nonspecific skin eruption: Secondary | ICD-10-CM | POA: Diagnosis not present

## 2016-07-12 MED ORDER — TRIAMCINOLONE ACETONIDE 0.5 % EX CREA: 1.0000 "application " | TOPICAL_CREAM | Freq: Three times a day (TID) | CUTANEOUS | 0 refills | Status: AC

## 2016-07-12 NOTE — Patient Instructions (Addendum)
  Great to meet you!  Your rash appears to be eczema or an allergic reaction to something.   Try the cream 2-3 times a day for 7-10 days    IF you received an x-ray today, you will receive an invoice from [facility]. Please contact [facility] at [phone] with questions or concerns regarding your invoice.   IF you received labwork today, you will receive an invoice from [employer]. Please contact [facility] at [phone] with questions or concerns regarding your invoice.   Our billing staff will not be able to assist you with questions regarding bills from these companies.  You will be contacted with the lab results as soon as they are available. The fastest way to get your results is to activate your My Chart account. Instructions are located on the last page of this paperwork. If you have not heard from [place] regarding the results in 2 weeks, please contact this office.    We recommend that you schedule a mammogram for breast cancer screening. Typically, you do not need a referral to do this. Please contact a local imaging center to schedule your mammogram.  [facility] - [phone]  *ask for the Radiology Department [facility]) - [phone] or [phone]  [facility] - [phone] [facility] - [phone] [facility] - [phone]  *ask for the Radiology Department [facility] - [phone]  *ask for the Radiology Department [facility] - [phone]  *ask for the Mammography Department [facility] - [phone]

## 2016-07-12 NOTE — Progress Notes (Signed)
   HPI  Patient presents today here with an itchy rash  Pt explains that she had noticed the rash first 2 nights ago, she returned form [place] home the day before so she is fairly confident she has not been exposed to new allergens.   She states it is a very itchy rash. She does not have any fevers, chills,sweats, or difficulty tolerating foods or fluids.   She is leaving town again tomorrow and then again in a few days so she wanted to get it checked out.   She has a Hx of eczema, it is usually on the flexor surfaces for her.   She denies any new soaps, lotions, or detergents.   PMH: Smoking status noted ROS: Per HPI  Objective: BP 112/74   Pulse 96   Temp 98.4 F (36.9 C) (Oral)   Resp 18   Ht 5\' 4"  (1.626 m)   Wt 129 lb (58.5 kg)   LMP 05/08/2014   SpO2 99%   BMI 22.14 kg/m  Gen: NAD, alert, cooperative with exam HEENT: NCAT, EOMI, PERRL CV: RRR, good S1/S2, no murmur Resp: CTABL, no wheezes, non-labored Ext: No edema, warm Neuro: Alert and oriented, No gross deficits  Skin:  Maculopapular erythemetous rash covering her upper back above her bra and between the bra straps. No areas of fluctuance, induration, warmth   Assessment and plan:  # rash Atopic derm vs contact derm Treat with topical steroids Look for allergen No signs of infection or shingles.  RTC with any concerns    Meds ordered this encounter  Medications  . fluticasone (FLONASE) 50 MCG/ACT nasal spray    Sig: Place into both nostrils daily.  . diphenhydrAMINE (BENADRYL) 25 mg capsule    Sig: Take 25 mg by mouth every 6 (six) hours as needed.  . triamcinolone cream (KENALOG) 0.5 %    Sig: Apply 1 application topically 3 (three) times daily. For 7-10 days    Dispense:  30 g    Refill:  0    Kevin Fenton, MD 4:09 PM

## 2016-08-10 DIAGNOSIS — Z01419 Encounter for gynecological examination (general) (routine) without abnormal findings: Secondary | ICD-10-CM | POA: Diagnosis not present

## 2016-08-10 DIAGNOSIS — Z6821 Body mass index (BMI) 21.0-21.9, adult: Secondary | ICD-10-CM | POA: Diagnosis not present

## 2016-08-10 DIAGNOSIS — Z1231 Encounter for screening mammogram for malignant neoplasm of breast: Secondary | ICD-10-CM | POA: Diagnosis not present

## 2016-08-30 DIAGNOSIS — Z13228 Encounter for screening for other metabolic disorders: Secondary | ICD-10-CM | POA: Diagnosis not present

## 2016-08-30 DIAGNOSIS — Z1321 Encounter for screening for nutritional disorder: Secondary | ICD-10-CM | POA: Diagnosis not present

## 2016-08-30 DIAGNOSIS — M816 Localized osteoporosis [Lequesne]: Secondary | ICD-10-CM | POA: Diagnosis not present

## 2016-08-30 DIAGNOSIS — Z1329 Encounter for screening for other suspected endocrine disorder: Secondary | ICD-10-CM | POA: Diagnosis not present

## 2016-08-30 DIAGNOSIS — Z13220 Encounter for screening for lipoid disorders: Secondary | ICD-10-CM | POA: Diagnosis not present

## 2016-08-30 DIAGNOSIS — N958 Other specified menopausal and perimenopausal disorders: Secondary | ICD-10-CM | POA: Diagnosis not present

## 2016-08-30 DIAGNOSIS — Z8041 Family history of malignant neoplasm of ovary: Secondary | ICD-10-CM | POA: Diagnosis not present

## 2016-08-30 DIAGNOSIS — Z13820 Encounter for screening for osteoporosis: Secondary | ICD-10-CM | POA: Diagnosis not present

## 2016-09-12 DIAGNOSIS — R9341 Abnormal radiologic findings on diagnostic imaging of renal pelvis, ureter, or bladder: Secondary | ICD-10-CM | POA: Diagnosis not present

## 2016-10-10 DIAGNOSIS — I358 Other nonrheumatic aortic valve disorders: Secondary | ICD-10-CM | POA: Diagnosis not present

## 2016-10-24 DIAGNOSIS — H10413 Chronic giant papillary conjunctivitis, bilateral: Secondary | ICD-10-CM | POA: Diagnosis not present

## 2017-07-04 DIAGNOSIS — H00025 Hordeolum internum left lower eyelid: Secondary | ICD-10-CM | POA: Diagnosis not present

## 2017-07-05 DIAGNOSIS — Z23 Encounter for immunization: Secondary | ICD-10-CM | POA: Diagnosis not present

## 2017-08-15 DIAGNOSIS — Z01419 Encounter for gynecological examination (general) (routine) without abnormal findings: Secondary | ICD-10-CM | POA: Diagnosis not present

## 2017-08-15 DIAGNOSIS — Z6821 Body mass index (BMI) 21.0-21.9, adult: Secondary | ICD-10-CM | POA: Diagnosis not present

## 2017-08-15 DIAGNOSIS — Z1231 Encounter for screening mammogram for malignant neoplasm of breast: Secondary | ICD-10-CM | POA: Diagnosis not present

## 2017-11-05 ENCOUNTER — Other Ambulatory Visit: Payer: Self-pay

## 2017-11-05 ENCOUNTER — Encounter: Payer: Self-pay | Admitting: Physician Assistant

## 2017-11-05 ENCOUNTER — Ambulatory Visit (INDEPENDENT_AMBULATORY_CARE_PROVIDER_SITE_OTHER): Payer: BLUE CROSS/BLUE SHIELD | Admitting: Physician Assistant

## 2017-11-05 VITALS — BP 112/80 | HR 90 | Temp 98.5°F | Resp 16 | Ht 64.25 in | Wt 125.8 lb

## 2017-11-05 DIAGNOSIS — R21 Rash and other nonspecific skin eruption: Secondary | ICD-10-CM

## 2017-11-05 LAB — GLUCOSE, POCT (MANUAL RESULT ENTRY): POC Glucose: 130 mg/dl — AB (ref 70–99)

## 2017-11-05 MED ORDER — RANITIDINE HCL 150 MG PO TABS: 150.0000 mg | ORAL_TABLET | Freq: Every day | ORAL | 0 refills | Status: AC

## 2017-11-05 MED ORDER — HYDROXYZINE HCL 25 MG PO TABS: 12.5000 mg | ORAL_TABLET | Freq: Three times a day (TID) | ORAL | 0 refills | Status: AC | PRN

## 2017-11-05 MED ORDER — PREDNISONE 10 MG PO TABS: ORAL_TABLET | ORAL | 0 refills | Status: AC

## 2017-11-05 MED ORDER — CETIRIZINE HCL 10 MG PO TABS: 10.0000 mg | ORAL_TABLET | Freq: Every day | ORAL | 0 refills | Status: AC

## 2017-11-05 NOTE — Progress Notes (Signed)
Amber HoustonKathy Raborn  MRN: [id number] DOB: 23-Apr-1961  Subjective:  Amber Brennan is a 56 y.o. female who presents for evaluation of a rash involving the chest. Rash started 1 day ago. Lesions are red, and raised in texture. Rash is pruritic. Associated symptoms: none. Patient denies: abdominal pain, arthralgia, congestion, cough, crankiness, decrease in appetite, fever, headache, irritability, myalgia, nausea, sore throat and vomiting. Patient has not had contacts with similar rash. Patient has had new exposures.  She just bought a new lotion last week.  She used it yesterday.  She also worked on the yard her lots of plants this weekend.  Denies new medication, food, or insect exposure. Has hx sensistive skin and eczema.  Notes she used to break out to any lotion that had a fragrance.  She also has a bruise on her left lower leg, which she does not know how she got.  Cannot recall hitting it against something.  Has tried topical triamcinolone cream moderate relief.  Review of Systems  HENT: Negative for trouble swallowing.   Respiratory: Negative for choking, chest tightness, shortness of breath and wheezing.       Patient Active Problem List   Diagnosis Date Noted  . Dyslipidemia 03/28/2012  . Chest pain 03/08/2012  . Rotator cuff tear, right 03/07/2011  . DJD (degenerative joint disease) of cervical spine 03/07/2011  . SHOULDER PAIN, RIGHT 02/14/2011  . MUSCLE SPASM, TRAPEZIUS MUSCLE, LEFT 02/14/2011  . BUNION, RIGHT FOOT 02/28/2010  . FOOT PAIN, RIGHT 02/28/2010  . UNEQUAL LEG LENGTH 02/28/2010    Current Outpatient Medications on File Prior to Visit  Medication Sig Dispense Refill  . diphenhydrAMINE (BENADRYL) 25 mg capsule Take 25 mg by mouth every 6 (six) hours as needed.    . Multiple Vitamin (MULITIVITAMIN WITH MINERALS) TABS Take 1 tablet by mouth daily.    Marland Kitchen. triamcinolone cream (KENALOG) 0.5 % Apply 1 application topically 3 (three) times daily. For 7-10 days 30 g 0  . B  Complex-C (B-COMPLEX WITH VITAMIN C) tablet Take 1 tablet by mouth daily.    . calcium gluconate 500 MG tablet Take 500 mg by mouth daily.    . fluticasone (FLONASE) 50 MCG/ACT nasal spray Place into both nostrils daily.    . Glucosamine-Chondroitin (OSTEO BI-FLEX REGULAR STRENGTH) 250-200 MG TABS Take 1 tablet by mouth daily.    . montelukast (SINGULAIR) 10 MG tablet TAKE 1 TABLET BY MOUTH EVERY NIGHT AT BEDTIME (Patient not taking: Reported on 07/12/2016) 30 tablet 2  . ORTHO TRI-CYCLEN LO 0.18/0.215/0.25 MG-25 MCG TABS     . [DISCONTINUED] gabapentin (NEURONTIN) 300 MG capsule      No current facility-administered medications on file prior to visit.     Allergies  Allergen Reactions  . Penicillins Other (See Comments)    Gi upset   . Sulfonamide Derivatives Hives and Itching     Objective:  BP 112/80 (BP Location: Right Arm, Patient Position: Sitting, Cuff Size: Normal)   Pulse 90   Temp 98.5 F (36.9 C) (Oral)   Resp 16   Ht 5' 4.25" (1.632 m)   Wt 125 lb 12.8 oz (57.1 kg)   LMP 05/08/2014   SpO2 98%   BMI 21.43 kg/m   Physical Exam  Constitutional: She is oriented to person, place, and time and well-developed, well-nourished, and in no distress.  HENT:  Head: Normocephalic and atraumatic.  Eyes: Conjunctivae are normal.  Neck: Normal range of motion.  Pulmonary/Chest: Effort normal.  Neurological: She is alert  and oriented to person, place, and time. Gait normal.  Skin: Skin is warm and dry. Ecchymosis (large (~4-6cm in circumfernce) ecchymosis noted on posterior aspect of left calf, overlying tenderness. ) and rash (diffuse erythematous maculopapular rash noted on anterior/posterior trunk, upper extremities, and lower extremities bilaterally. There is overlying excoriations noted., no face involvement noted) noted.  Psychiatric: Affect normal.  Vitals reviewed.  Results for orders placed or performed in visit on 11/05/17 (from the past 24 hour(s))  POCT glucose  (manual entry)     Status: Abnormal   Collection Time: 11/05/17  3:44 PM  Result Value Ref Range   POC Glucose 130 (A) 70 - 99 mg/dl    Assessment and Plan :  1. Rash and nonspecific skin eruption Rash consistent with either irritant or allergic contact dermatitis.  Due to the diffuse involvement, will treat with oral prednisone at this time.  Also given Rx for oral Zyrtec, Zantac, and hydroxyzine.  Educated that the bruise appears normal.  Use ice to affected area as needed.  Return if she starts developing more bruises with unknown cause. Return to clinic if rash symptoms worsen, do not improve in 7-10 days, or as needed. - POCT glucose (manual entry) - ranitidine (ZANTAC) 150 MG tablet; Take 1 tablet (150 mg total) daily by mouth.  Dispense: 30 tablet; Refill: 0 - cetirizine (ZYRTEC) 10 MG tablet; Take 1 tablet (10 mg total) daily by mouth.  Dispense: 30 tablet; Refill: 0 - predniSONE (DELTASONE) 10 MG tablet; 6-5-4-3-2-1 taper. Take all the tablets for that day in the am with food.  Dispense: 21 tablet; Refill: 0 - hydrOXYzine (ATARAX/VISTARIL) 25 MG tablet; Take 0.5-1 tablets (12.5-25 mg total) every 8 (eight) hours as needed by mouth for itching.  Dispense: 30 tablet; Refill: 0  Benjiman CoreBrittany Charlcie Prisco PA-C  [facility] at [facility]  [facility] 11/05/2017 3:15 PM

## 2017-11-05 NOTE — Patient Instructions (Addendum)
Your rash is consistent with either an irritant or allergic contact dermatitis.  Considering it is so widespread, I recommend we treat with oral prednisone at this time.  I also recommend the use of oral Zyrtec and Zantac daily.  Prednisone is a steroid and can cause side effects such as headache, irritability, nausea, vomiting, increased heart rate, increased blood pressure, increased blood sugar, appetite changes, and insomnia. Please take tablets in the morning with a full meal to help decrease the chances of these side effects.   I have also given you a prescription for hydroxyzine to use at night for itching.  It is related to Benadryl so do not take Benadryl with it.  It can cause drowsiness.  I also recommend avoiding the new lotion that you just bought.  In terms of your bruise, I recommend applying ice 4-5 times a day as needed.  Please return if you start to develop more bruises without known causes, as we may need to do additional blood work at that time.  Return here if your symptoms worsen or your rash does not resolve with the treatment.  Thank you for letting me participate in your health and well being.  Rash A rash is a change in the color of the skin. A rash can also change the way your skin feels. There are many different conditions and factors that can cause a rash. Follow these instructions at home: Pay attention to any changes in your symptoms. Follow these instructions to help with your condition: Medicine Take or apply over-the-counter and prescription medicines only as told by your doctor. These may include:  Corticosteroid cream.  Anti-itch lotions.  Oral antihistamines.  Skin Care  Put cool compresses on the affected areas.  Try taking a bath with: ? Epsom salts. Follow the instructions on the packaging. You can get these at your local pharmacy or grocery store. ? Baking soda. Pour a small amount into the bath as told by your doctor. ? Colloidal oatmeal.  Follow the instructions on the packaging. You can get this at your local pharmacy or grocery store.  Try putting baking soda paste onto your skin. Stir water into baking soda until it gets like a paste.  Do not scratch or rub your skin.  Avoid covering the rash. Make sure the rash is exposed to air as much as possible. General instructions  Avoid hot showers or baths, which can make itching worse. A cold shower may help.  Avoid scented soaps, detergents, and perfumes. Use gentle soaps, detergents, perfumes, and other cosmetic products.  Avoid anything that causes your rash. Keep a journal to help track what causes your rash. Write down: ? What you eat. ? What cosmetic products you use. ? What you drink. ? What you wear. This includes jewelry.  Keep all follow-up visits as told by your doctor. This is important. Contact a doctor if:  You sweat at night.  You lose weight.  You pee (urinate) more than normal.  You feel weak.  You throw up (vomit).  Your skin or the whites of your eyes look yellow (jaundice).  Your skin: ? Tingles. ? Is numb.  Your rash: ? Does not go away after a few days. ? Gets worse.  You are: ? More thirsty than normal. ? More tired than normal.  You have: ? New symptoms. ? Pain in your belly (abdomen). ? A fever. ? Watery poop (diarrhea). Get help right away if:  Your rash covers all or most  of your body. The rash may or may not be painful.  You have blisters that: ? Are on top of the rash. ? Grow larger. ? Grow together. ? Are painful. ? Are inside your nose or mouth.  You have a rash that: ? Looks like purple pinprick-sized spots all over your body. ? Has a "bull's eye" or looks like a target. ? Is red and painful, causes your skin to peel, and is not from being in the sun too long. This information is not intended to replace advice given to you by your health care provider. Make sure you discuss any questions you have with your  health care provider. Document Released: 05/22/2008 Document Revised: 05/11/2016 Document Reviewed: 04/21/2015 Elsevier Interactive Patient Education  2018 [employer]        IF you received an x-ray today, you will receive an invoice from [facility]. Please contact [facility] at [phone] with questions or concerns regarding your invoice.   IF you received labwork today, you will receive an invoice from [facility]. Please contact [facility] at [phone] with questions or concerns regarding your invoice.   Our billing staff will not be able to assist you with questions regarding bills from these companies.  You will be contacted with the lab results as soon as they are available. The fastest way to get your results is to activate your My Chart account. Instructions are located on the last page of this paperwork. If you have not heard from us regarding the results in 2 weeks, please contact this office.

## 2017-11-21 ENCOUNTER — Other Ambulatory Visit: Payer: Self-pay

## 2017-11-21 ENCOUNTER — Ambulatory Visit (INDEPENDENT_AMBULATORY_CARE_PROVIDER_SITE_OTHER): Payer: BLUE CROSS/BLUE SHIELD | Admitting: Physician Assistant

## 2017-11-21 ENCOUNTER — Encounter: Payer: Self-pay | Admitting: Physician Assistant

## 2017-11-21 VITALS — BP 122/78 | HR 78 | Resp 16 | Ht 64.25 in | Wt 128.2 lb

## 2017-11-21 DIAGNOSIS — R21 Rash and other nonspecific skin eruption: Secondary | ICD-10-CM

## 2017-11-21 MED ORDER — CLINDAMYCIN HCL 300 MG PO CAPS: 300.0000 mg | ORAL_CAPSULE | Freq: Three times a day (TID) | ORAL | 0 refills | Status: AC

## 2017-11-21 MED ORDER — NYSTATIN 100000 UNIT/GM EX CREA: 1.0000 "application " | TOPICAL_CREAM | Freq: Two times a day (BID) | CUTANEOUS | 0 refills | Status: AC

## 2017-11-21 NOTE — Patient Instructions (Signed)
Please take medication as prescribed.   If the rash worsens or does not improve in 10 days, please return.   Clindamycin capsules What is this medicine? CLINDAMYCIN (KLIN da MYE sin) is a lincosamide antibiotic. It is used to treat certain kinds of bacterial infections. It will not work for colds, flu, or other viral infections. This medicine may be used for other purposes; ask your health care provider or pharmacist if you have questions. COMMON BRAND NAME(S): Cleocin What should I tell my health care provider before I take this medicine? They need to know if you have any of these conditions: -kidney disease -liver disease -stomach problems like colitis -an unusual or allergic reaction to clindamycin, lincomycin, or other medicines, foods, dyes like tartrazine or preservatives -pregnant or trying to get pregnant -breast-feeding How should I use this medicine? Take this medicine by mouth with a full glass of water. Follow the directions on the prescription label. You can take this medicine with food or on an empty stomach. If the medicine upsets your stomach, take it with food. Take your medicine at regular intervals. Do not take your medicine more often than directed. Take all of your medicine as directed even if you think your are better. Do not skip doses or stop your medicine early. Talk to your pediatrician regarding the use of this medicine in children. Special care may be needed. Overdosage: If you think you have taken too much of this medicine contact a poison control center or emergency room at once. NOTE: This medicine is only for you. Do not share this medicine with others. What if I miss a dose? If you miss a dose, take it as soon as you can. If it is almost time for your next dose, take only that dose. Do not take double or extra doses. What may interact with this medicine? -birth control pills -erythromycin -medicines that relax muscles for surgery -rifampin This list may not  describe all possible interactions. Give your health care provider a list of all the medicines, herbs, non-prescription drugs, or dietary supplements you use. Also tell them if you smoke, drink alcohol, or use illegal drugs. Some items may interact with your medicine. What should I watch for while using this medicine? Tell your doctor or healthcare professional if your symptoms do not start to get better or if they get worse. Do not treat diarrhea with over the counter products. Contact your doctor if you have diarrhea that lasts more than 2 days or if it is severe and watery. What side effects may I notice from receiving this medicine? Side effects that you should report to your doctor or health care professional as soon as possible: -allergic reactions like skin rash, itching or hives, swelling of the face, lips, or tongue -dark urine -pain on swallowing -redness, blistering, peeling or loosening of the skin, including inside the mouth -unusual bleeding or bruising -unusually weak or tired -yellowing of eyes or skin Side effects that usually do not require medical attention (report to your doctor or health care professional if they continue or are bothersome): -diarrhea -itching in the rectal or genital area -joint pain -nausea, vomiting -stomach pain This list may not describe all possible side effects. Call your doctor for medical advice about side effects. You may report side effects to FDA at 1-800-FDA-1088. Where should I keep my medicine? Keep out of the reach of children. Store at room temperature between 20 and 25 degrees C (68 and 77 degrees F). Throw away  any unused medicine after the expiration date. NOTE: This sheet is a summary. It may not cover all possible information. If you have questions about this medicine, talk to your doctor, pharmacist, or health care provider.  2018 Elsevier/Gold Standard (2016-03-08 16:34:00)

## 2017-11-24 DIAGNOSIS — L270 Generalized skin eruption due to drugs and medicaments taken internally: Secondary | ICD-10-CM | POA: Diagnosis not present

## 2017-11-25 NOTE — Progress Notes (Signed)
[facility] [address], [place] [phone]  Date:  11/21/2017   Name:  Amber HoustonKathy Brennan   DOB:  06/02/1961   MRN:  [id number]  PCP:  Richardean ChimeraMcComb, John, MD    History of Present Illness:  Amber Brennan is a 56 y.o. female patient who presents to PCP with  Chief Complaint  Patient presents with  . Rash    under both breasts, noticed this morning, no pain or spreading     Patient notices as she was taking off her bra that she had a redness under her breast.  She then noticed that it was expansive to the fold of her left breast and some on her right breast.  She has noted heat to the area.  No pain or pruritic character.  She denies fever.  She has been wearing a bra that she has always had that never caused any side effects.  No new hygeine products.  She hass never had this before.  Patient Active Problem List   Diagnosis Date Noted  . Dyslipidemia 03/28/2012  . Chest pain 03/08/2012  . Rotator cuff tear, right 03/07/2011  . DJD (degenerative joint disease) of cervical spine 03/07/2011  . SHOULDER PAIN, RIGHT 02/14/2011  . MUSCLE SPASM, TRAPEZIUS MUSCLE, LEFT 02/14/2011  . BUNION, RIGHT FOOT 02/28/2010  . FOOT PAIN, RIGHT 02/28/2010  . UNEQUAL LEG LENGTH 02/28/2010    Past Medical History:  Diagnosis Date  . Seasonal rhinitis     Past Surgical History:  Procedure Laterality Date  . BUNIONECTOMY  02/2009    Social History   Tobacco Use  . Smoking status: Never Smoker  . Smokeless tobacco: Never Used  Substance Use Topics  . Alcohol use: No  . Drug use: No    Family History  Problem Relation Age of Onset  . Heart disease Father 1879  . Cancer Father   . Hypertension Mother   . Cancer Mother [id number]       Ovarian    Allergies  Allergen Reactions  . Penicillins Other (See Comments)    Gi upset   . Sulfonamide Derivatives Hives and Itching    Medication list has been reviewed and updated.  Current Outpatient Medications on File Prior to  Visit  Medication Sig Dispense Refill  . B Complex-C (B-COMPLEX WITH VITAMIN C) tablet Take 1 tablet by mouth daily.    . calcium gluconate 500 MG tablet Take 500 mg by mouth daily.    . cetirizine (ZYRTEC) 10 MG tablet Take 1 tablet (10 mg total) daily by mouth. 30 tablet 0  . diphenhydrAMINE (BENADRYL) 25 mg capsule Take 25 mg by mouth every 6 (six) hours as needed.    . fluticasone (FLONASE) 50 MCG/ACT nasal spray Place into both nostrils daily.    . Glucosamine-Chondroitin (OSTEO BI-FLEX REGULAR STRENGTH) 250-200 MG TABS Take 1 tablet by mouth daily.    . hydrOXYzine (ATARAX/VISTARIL) 25 MG tablet Take 0.5-1 tablets (12.5-25 mg total) every 8 (eight) hours as needed by mouth for itching. 30 tablet 0  . montelukast (SINGULAIR) 10 MG tablet TAKE 1 TABLET BY MOUTH EVERY NIGHT AT BEDTIME 30 tablet 2  . Multiple Vitamin (MULITIVITAMIN WITH MINERALS) TABS Take 1 tablet by mouth daily.    Gaylord Shih. ORTHO TRI-CYCLEN LO 0.18/0.215/0.25 MG-25 MCG TABS     . predniSONE (DELTASONE) 10 MG tablet 6-5-4-3-2-1 taper. Take all the tablets for that day in the am with food. 21 tablet 0  . ranitidine (ZANTAC) 150  MG tablet Take 1 tablet (150 mg total) daily by mouth. 30 tablet 0  . triamcinolone cream (KENALOG) 0.5 % Apply 1 application topically 3 (three) times daily. For 7-10 days 30 g 0  . [DISCONTINUED] gabapentin (NEURONTIN) 300 MG capsule      No current facility-administered medications on file prior to visit.     ROS ROS otherwise unremarkable unless listed above.  Physical Examination: BP 122/78 (BP Location: Left Arm, Patient Position: Sitting, Cuff Size: Normal)   Pulse 78   Resp 16   Ht 5' 4.25" (1.632 m)   Wt 128 lb 3.2 oz (58.2 kg)   LMP 05/08/2014   SpO2 100%   BMI 21.83 kg/m  Ideal Body Weight: Weight in (lb) to have BMI = 25: 146.5  Physical Exam  Constitutional: She is oriented to person, place, and time. She appears well-developed and well-nourished. No distress.  HENT:  Head:  Normocephalic and atraumatic.  Right Ear: External ear normal.  Left Ear: External ear normal.  Eyes: Conjunctivae and EOM are normal. Pupils are equal, round, and reactive to light.  Cardiovascular: Normal rate.  Pulmonary/Chest: Effort normal. No respiratory distress.  Neurological: She is alert and oriented to person, place, and time.  Skin: Skin is warm and dry. She is not diaphoretic.  Half moon under the breast line of the left breast.  No warmth.  Erythematous and non-tender.   There is a 3cm erythematous breast pattern under the right breast of similar characteristic.   Psychiatric: She has a normal mood and affect. Her behavior is normal.     Assessment and Plan: Amber Brennan is a 56 y.o. female who is here today for cc of  Chief Complaint  Patient presents with  . Rash    under both breasts, noticed this morning, no pain or spreading   Will treat with clindamycin at this time.  Advised to start nystatin at this time topically, as well.  Rtc as needed. 1. Rash - clindamycin (CLEOCIN) 300 MG capsule; Take 1 capsule (300 mg total) by mouth 3 (three) times daily.  Dispense: 21 capsule; Refill: 0 - nystatin cream (MYCOSTATIN); Apply 1 application topically 2 (two) times daily.  Dispense: 30 g; Refill: 0   Trena Platt, PA-C [facility] 11/25/2017 4:35 PM

## 2017-12-05 DIAGNOSIS — M79641 Pain in right hand: Secondary | ICD-10-CM | POA: Diagnosis not present

## 2017-12-05 DIAGNOSIS — M9905 Segmental and somatic dysfunction of pelvic region: Secondary | ICD-10-CM | POA: Diagnosis not present

## 2017-12-05 DIAGNOSIS — M9904 Segmental and somatic dysfunction of sacral region: Secondary | ICD-10-CM | POA: Diagnosis not present

## 2017-12-05 DIAGNOSIS — M9903 Segmental and somatic dysfunction of lumbar region: Secondary | ICD-10-CM | POA: Diagnosis not present

## 2017-12-19 DIAGNOSIS — M9904 Segmental and somatic dysfunction of sacral region: Secondary | ICD-10-CM | POA: Diagnosis not present

## 2017-12-19 DIAGNOSIS — M9903 Segmental and somatic dysfunction of lumbar region: Secondary | ICD-10-CM | POA: Diagnosis not present

## 2017-12-19 DIAGNOSIS — M79641 Pain in right hand: Secondary | ICD-10-CM | POA: Diagnosis not present

## 2017-12-19 DIAGNOSIS — M9905 Segmental and somatic dysfunction of pelvic region: Secondary | ICD-10-CM | POA: Diagnosis not present

## 2018-01-28 DIAGNOSIS — B351 Tinea unguium: Secondary | ICD-10-CM | POA: Diagnosis not present

## 2018-03-27 ENCOUNTER — Encounter: Payer: Self-pay | Admitting: Physician Assistant

## 2018-08-12 DIAGNOSIS — M5136 Other intervertebral disc degeneration, lumbar region: Secondary | ICD-10-CM | POA: Diagnosis not present

## 2018-08-12 DIAGNOSIS — M9905 Segmental and somatic dysfunction of pelvic region: Secondary | ICD-10-CM | POA: Diagnosis not present

## 2018-08-12 DIAGNOSIS — M9904 Segmental and somatic dysfunction of sacral region: Secondary | ICD-10-CM | POA: Diagnosis not present

## 2018-08-12 DIAGNOSIS — M9903 Segmental and somatic dysfunction of lumbar region: Secondary | ICD-10-CM | POA: Diagnosis not present

## 2018-08-21 DIAGNOSIS — M5136 Other intervertebral disc degeneration, lumbar region: Secondary | ICD-10-CM | POA: Diagnosis not present

## 2018-08-21 DIAGNOSIS — M9903 Segmental and somatic dysfunction of lumbar region: Secondary | ICD-10-CM | POA: Diagnosis not present

## 2018-08-21 DIAGNOSIS — M9905 Segmental and somatic dysfunction of pelvic region: Secondary | ICD-10-CM | POA: Diagnosis not present

## 2018-08-21 DIAGNOSIS — M9904 Segmental and somatic dysfunction of sacral region: Secondary | ICD-10-CM | POA: Diagnosis not present

## 2018-08-26 DIAGNOSIS — Z6821 Body mass index (BMI) 21.0-21.9, adult: Secondary | ICD-10-CM | POA: Diagnosis not present

## 2018-08-26 DIAGNOSIS — Z800 Family history of malignant neoplasm of digestive organs: Secondary | ICD-10-CM | POA: Diagnosis not present

## 2018-08-26 DIAGNOSIS — Z8041 Family history of malignant neoplasm of ovary: Secondary | ICD-10-CM | POA: Diagnosis not present

## 2018-08-26 DIAGNOSIS — Z01419 Encounter for gynecological examination (general) (routine) without abnormal findings: Secondary | ICD-10-CM | POA: Diagnosis not present

## 2018-08-26 DIAGNOSIS — Z806 Family history of leukemia: Secondary | ICD-10-CM | POA: Diagnosis not present

## 2018-08-26 DIAGNOSIS — Z1231 Encounter for screening mammogram for malignant neoplasm of breast: Secondary | ICD-10-CM | POA: Diagnosis not present

## 2018-09-04 DIAGNOSIS — H10413 Chronic giant papillary conjunctivitis, bilateral: Secondary | ICD-10-CM | POA: Diagnosis not present

## 2018-09-18 DIAGNOSIS — H10413 Chronic giant papillary conjunctivitis, bilateral: Secondary | ICD-10-CM | POA: Diagnosis not present

## 2019-03-26 DIAGNOSIS — S80869A Insect bite (nonvenomous), unspecified lower leg, initial encounter: Secondary | ICD-10-CM | POA: Diagnosis not present

## 2019-03-26 DIAGNOSIS — L089 Local infection of the skin and subcutaneous tissue, unspecified: Secondary | ICD-10-CM | POA: Diagnosis not present

## 2019-03-26 DIAGNOSIS — S70269A Insect bite (nonvenomous), unspecified hip, initial encounter: Secondary | ICD-10-CM | POA: Diagnosis not present

## 2019-03-26 DIAGNOSIS — B373 Candidiasis of vulva and vagina: Secondary | ICD-10-CM | POA: Diagnosis not present

## 2019-05-21 DIAGNOSIS — M9903 Segmental and somatic dysfunction of lumbar region: Secondary | ICD-10-CM | POA: Diagnosis not present

## 2019-05-21 DIAGNOSIS — M9905 Segmental and somatic dysfunction of pelvic region: Secondary | ICD-10-CM | POA: Diagnosis not present

## 2019-05-21 DIAGNOSIS — M9904 Segmental and somatic dysfunction of sacral region: Secondary | ICD-10-CM | POA: Diagnosis not present

## 2019-05-21 DIAGNOSIS — M545 Low back pain: Secondary | ICD-10-CM | POA: Diagnosis not present

## 2019-09-22 DIAGNOSIS — Z6821 Body mass index (BMI) 21.0-21.9, adult: Secondary | ICD-10-CM | POA: Diagnosis not present

## 2019-09-22 DIAGNOSIS — Z01419 Encounter for gynecological examination (general) (routine) without abnormal findings: Secondary | ICD-10-CM | POA: Diagnosis not present

## 2019-09-22 DIAGNOSIS — Z87410 Personal history of cervical dysplasia: Secondary | ICD-10-CM | POA: Diagnosis not present

## 2019-09-22 DIAGNOSIS — Z1231 Encounter for screening mammogram for malignant neoplasm of breast: Secondary | ICD-10-CM | POA: Diagnosis not present

## 2019-10-08 DIAGNOSIS — L814 Other melanin hyperpigmentation: Secondary | ICD-10-CM | POA: Diagnosis not present

## 2019-10-08 DIAGNOSIS — D224 Melanocytic nevi of scalp and neck: Secondary | ICD-10-CM | POA: Diagnosis not present

## 2019-10-08 DIAGNOSIS — L570 Actinic keratosis: Secondary | ICD-10-CM | POA: Diagnosis not present

## 2019-10-08 DIAGNOSIS — D2262 Melanocytic nevi of left upper limb, including shoulder: Secondary | ICD-10-CM | POA: Diagnosis not present

## 2019-10-08 DIAGNOSIS — L821 Other seborrheic keratosis: Secondary | ICD-10-CM | POA: Diagnosis not present

## 2019-10-15 DIAGNOSIS — Z1211 Encounter for screening for malignant neoplasm of colon: Secondary | ICD-10-CM | POA: Diagnosis not present

## 2019-10-17 DIAGNOSIS — Z13220 Encounter for screening for lipoid disorders: Secondary | ICD-10-CM | POA: Diagnosis not present

## 2019-10-17 DIAGNOSIS — Z13228 Encounter for screening for other metabolic disorders: Secondary | ICD-10-CM | POA: Diagnosis not present

## 2019-10-17 DIAGNOSIS — Z1329 Encounter for screening for other suspected endocrine disorder: Secondary | ICD-10-CM | POA: Diagnosis not present

## 2019-10-17 DIAGNOSIS — Z8041 Family history of malignant neoplasm of ovary: Secondary | ICD-10-CM | POA: Diagnosis not present

## 2019-10-17 DIAGNOSIS — Z13820 Encounter for screening for osteoporosis: Secondary | ICD-10-CM | POA: Diagnosis not present

## 2019-11-05 DIAGNOSIS — M810 Age-related osteoporosis without current pathological fracture: Secondary | ICD-10-CM | POA: Diagnosis not present

## 2019-11-22 DIAGNOSIS — Z20828 Contact with and (suspected) exposure to other viral communicable diseases: Secondary | ICD-10-CM | POA: Diagnosis not present

## 2020-07-30 DIAGNOSIS — L2389 Allergic contact dermatitis due to other agents: Secondary | ICD-10-CM | POA: Diagnosis not present

## 2020-09-29 DIAGNOSIS — Z6822 Body mass index (BMI) 22.0-22.9, adult: Secondary | ICD-10-CM | POA: Diagnosis not present

## 2020-09-29 DIAGNOSIS — Z01419 Encounter for gynecological examination (general) (routine) without abnormal findings: Secondary | ICD-10-CM | POA: Diagnosis not present

## 2020-09-29 DIAGNOSIS — Z1231 Encounter for screening mammogram for malignant neoplasm of breast: Secondary | ICD-10-CM | POA: Diagnosis not present

## 2020-10-27 ENCOUNTER — Telehealth: Payer: Self-pay | Admitting: Dermatology

## 2020-10-27 NOTE — Telephone Encounter (Signed)
Refill needed for Kerydin 5% solution. [facility] is the pharmacy the patient was receiving this from.

## 2020-10-27 NOTE — Telephone Encounter (Signed)
Left patient message to call us back. 

## 2020-10-27 NOTE — Telephone Encounter (Signed)
Chart # [id number] in message stack

## 2020-10-27 NOTE — Telephone Encounter (Signed)
Offered to schedule follow up appointment. She wants to know if she can get refill without having to be seen.

## 2020-11-02 DIAGNOSIS — Z8041 Family history of malignant neoplasm of ovary: Secondary | ICD-10-CM | POA: Diagnosis not present

## 2020-11-15 DIAGNOSIS — H01111 Allergic dermatitis of right upper eyelid: Secondary | ICD-10-CM | POA: Diagnosis not present

## 2022-11-29 LAB — COLOGUARD

## 2022-12-25 LAB — COLOGUARD: COLOGUARD: NEGATIVE

## 2024-12-02 ENCOUNTER — Other Ambulatory Visit: Payer: Self-pay | Admitting: Obstetrics and Gynecology

## 2024-12-02 DIAGNOSIS — Z8249 Family history of ischemic heart disease and other diseases of the circulatory system: Secondary | ICD-10-CM

## 2024-12-26 ENCOUNTER — Ambulatory Visit
Admission: RE | Admit: 2024-12-26 | Discharge: 2024-12-26 | Disposition: A | Payer: Self-pay | Source: Ambulatory Visit | Attending: Obstetrics and Gynecology | Admitting: Obstetrics and Gynecology

## 2024-12-26 DIAGNOSIS — Z8249 Family history of ischemic heart disease and other diseases of the circulatory system: Secondary | ICD-10-CM
